# Patient Record
Sex: Male | Born: 1940 | Race: White | Hispanic: No | Marital: Married | Smoking: Never smoker
Health system: Southern US, Community
[De-identification: ages and names within clinical notes are randomized; demographics above are authoritative.]

## PROBLEM LIST (undated history)

## (undated) DIAGNOSIS — I10 Essential (primary) hypertension: Secondary | ICD-10-CM

## (undated) DIAGNOSIS — D332 Benign neoplasm of brain, unspecified: Secondary | ICD-10-CM

## (undated) DIAGNOSIS — G629 Polyneuropathy, unspecified: Secondary | ICD-10-CM

## (undated) DIAGNOSIS — H269 Unspecified cataract: Secondary | ICD-10-CM

## (undated) DIAGNOSIS — E119 Type 2 diabetes mellitus without complications: Secondary | ICD-10-CM

## (undated) DIAGNOSIS — Z9289 Personal history of other medical treatment: Secondary | ICD-10-CM

## (undated) DIAGNOSIS — E786 Lipoprotein deficiency: Secondary | ICD-10-CM

## (undated) DIAGNOSIS — C801 Malignant (primary) neoplasm, unspecified: Secondary | ICD-10-CM

## (undated) HISTORY — PX: CHOLECYSTECTOMY: SHX55

## (undated) HISTORY — PX: CERVICAL FUSION: SHX112

## (undated) HISTORY — PX: COLONOSCOPY: SHX174

---

## 2012-07-10 ENCOUNTER — Encounter (HOSPITAL_COMMUNITY): Payer: Self-pay | Admitting: *Deleted

## 2012-07-10 ENCOUNTER — Emergency Department (HOSPITAL_COMMUNITY): Payer: Medicare Other

## 2012-07-10 ENCOUNTER — Emergency Department (HOSPITAL_COMMUNITY)
Admission: EM | Admit: 2012-07-10 | Discharge: 2012-07-10 | Disposition: A | Discharge status: Home / Self Care | Payer: Medicare Other | Attending: Emergency Medicine | Admitting: Emergency Medicine

## 2012-07-10 DIAGNOSIS — R61 Generalized hyperhidrosis: Secondary | ICD-10-CM | POA: Insufficient documentation

## 2012-07-10 DIAGNOSIS — R5383 Other fatigue: Secondary | ICD-10-CM | POA: Insufficient documentation

## 2012-07-10 DIAGNOSIS — E162 Hypoglycemia, unspecified: Secondary | ICD-10-CM

## 2012-07-10 DIAGNOSIS — I4891 Unspecified atrial fibrillation: Secondary | ICD-10-CM | POA: Insufficient documentation

## 2012-07-10 DIAGNOSIS — R5381 Other malaise: Secondary | ICD-10-CM | POA: Insufficient documentation

## 2012-07-10 DIAGNOSIS — Z794 Long term (current) use of insulin: Secondary | ICD-10-CM | POA: Insufficient documentation

## 2012-07-10 DIAGNOSIS — Z79899 Other long term (current) drug therapy: Secondary | ICD-10-CM | POA: Insufficient documentation

## 2012-07-10 DIAGNOSIS — E1169 Type 2 diabetes mellitus with other specified complication: Secondary | ICD-10-CM | POA: Insufficient documentation

## 2012-07-10 DIAGNOSIS — E7800 Pure hypercholesterolemia, unspecified: Secondary | ICD-10-CM | POA: Insufficient documentation

## 2012-07-10 DIAGNOSIS — I10 Essential (primary) hypertension: Secondary | ICD-10-CM | POA: Insufficient documentation

## 2012-07-10 HISTORY — DX: Lipoprotein deficiency: E78.6

## 2012-07-10 HISTORY — DX: Type 2 diabetes mellitus without complications: E11.9

## 2012-07-10 HISTORY — DX: Essential (primary) hypertension: I10

## 2012-07-10 LAB — COMPREHENSIVE METABOLIC PANEL
ALT: 17 U/L (ref 0–53)
AST: 21 U/L (ref 0–37)
Alkaline Phosphatase: 59 U/L (ref 39–117)
CO2: 26 mEq/L (ref 19–32)
Calcium: 9.3 mg/dL (ref 8.4–10.5)
GFR calc Af Amer: 61 mL/min — ABNORMAL LOW (ref >90)
GFR calc non Af Amer: 53 mL/min — ABNORMAL LOW (ref >90)
Glucose, Bld: 98 mg/dL (ref 70–99)
Potassium: 3.7 mEq/L (ref 3.5–5.1)
Sodium: 136 mEq/L (ref 135–145)

## 2012-07-10 LAB — CBC WITH DIFFERENTIAL/PLATELET
Basophils Absolute: 0.1 10*3/uL (ref 0.0–0.1)
Basophils Relative: 1 % (ref 0–1)
Eosinophils Absolute: 0.2 10*3/uL (ref 0.0–0.7)
Eosinophils Relative: 1 % (ref 0–5)
HCT: 38.2 % — ABNORMAL LOW (ref 39.0–52.0)
Hemoglobin: 13.3 g/dL (ref 13.0–17.0)
Lymphocytes Relative: 11 % — ABNORMAL LOW (ref 12–46)
Lymphs Abs: 1.7 10*3/uL (ref 0.7–4.0)
MCH: 29.3 pg (ref 26.0–34.0)
MCHC: 34.8 g/dL (ref 30.0–36.0)
MCV: 84.1 fL (ref 78.0–100.0)
Monocytes Absolute: 1.9 10*3/uL — ABNORMAL HIGH (ref 0.1–1.0)
Monocytes Relative: 12 % (ref 3–12)
Neutro Abs: 11.6 10*3/uL — ABNORMAL HIGH (ref 1.7–7.7)
Neutrophils Relative %: 75 % (ref 43–77)
Platelets: 215 10*3/uL (ref 150–400)
RBC: 4.54 MIL/uL (ref 4.22–5.81)
RDW: 13.5 % (ref 11.5–15.5)
WBC: 15.5 10*3/uL — ABNORMAL HIGH (ref 4.0–10.5)

## 2012-07-10 LAB — GLUCOSE, CAPILLARY
Glucose-Capillary: 165 mg/dL — ABNORMAL HIGH (ref 70–99)
Glucose-Capillary: 174 mg/dL — ABNORMAL HIGH (ref 70–99)
Glucose-Capillary: 94 mg/dL (ref 70–99)

## 2012-07-10 LAB — URINALYSIS, ROUTINE W REFLEX MICROSCOPIC
Glucose, UA: NEGATIVE mg/dL
Hgb urine dipstick: NEGATIVE
Leukocytes, UA: NEGATIVE
Specific Gravity, Urine: 1.007 (ref 1.005–1.030)
pH: 5.5 (ref 5.0–8.0)

## 2012-07-10 LAB — POCT I-STAT TROPONIN I: Troponin i, poc: 0 ng/mL (ref 0.00–0.08)

## 2012-07-10 LAB — CG4 I-STAT (LACTIC ACID): Lactic Acid, Venous: 3.45 mmol/L — ABNORMAL HIGH (ref 0.5–2.2)

## 2012-07-10 MED ORDER — METOPROLOL TARTRATE 25 MG PO TABS: 25.0000 mg | ORAL_TABLET | Freq: Two times a day (BID) | ORAL | Status: DC

## 2012-07-10 MED ORDER — METOPROLOL TARTRATE 100 MG PO TABS: 50.0000 mg | ORAL_TABLET | Freq: Two times a day (BID) | ORAL | Status: DC

## 2012-07-10 MED ORDER — DILTIAZEM HCL 25 MG/5ML IV SOLN
10.0000 mg | Freq: Once | INTRAVENOUS | Status: AC
  Administered 2012-07-10: 10 mg via INTRAVENOUS
  Filled 2012-07-10: qty 5

## 2012-07-10 NOTE — ED Notes (Signed)
Original call was for patient with a sugar of 39.  Took his bedtime insulin without eating (35units Lantus).  EMS arrival [facility] 84.  Upon arrival to the hospital he was in A Fib.  2348 BS 86  Patient c/o feeling weak

## 2012-07-10 NOTE — ED Notes (Signed)
Family at bedside. 

## 2012-07-10 NOTE — ED Notes (Signed)
Pt ambulated to restroom. 

## 2012-07-10 NOTE — ED Provider Notes (Signed)
History     CSN: [id number]  Arrival date & time 07/10/12  0008   First MD Initiated Contact with Patient 07/10/12 0030      Chief Complaint  Patient presents with  . Hypoglycemia    (Consider location/radiation/quality/duration/timing/severity/associated sxs/prior treatment) The history is provided by the patient, the EMS personnel and the spouse.   a 72 year old male was in the [place] area. Primary care Dr. is in the [place] area. Patient is a known diabetic he was feeling fine until after taking his bedtime insulin dose of 35 units of Lantus which is his normal amount he had had dinner earlier but did not eat as much his usual. Wife noted that his blood sugar was low at 39 she came sugary drinks when EMS arrived blood sugar was 84. EMS also noted as yet the hospital he had a rapid irregular rate of atrial fib heart rate upon arrival here was 147. Patient was ashen. Patient denied any chest pain abdominal pain or shortness of breath. Patient does have a history of atrial fib.  Past Medical History  Diagnosis Date  . Hypertension   . Hypocholesteremia   . Diabetes mellitus without complication     History reviewed. No pertinent past surgical history.  History reviewed. No pertinent family history.  History  Substance Use Topics  . Smoking status: Never Smoker   . Smokeless tobacco: Never Used  . Alcohol Use: No      Review of Systems  Constitutional: Positive for diaphoresis and fatigue. Negative for fever.  Respiratory: Negative for chest tightness and shortness of breath.   Cardiovascular: Negative for chest pain and palpitations.  Gastrointestinal: Negative for nausea, vomiting and abdominal pain.  Genitourinary: Negative for dysuria.  Skin: Positive for pallor.  Neurological: Negative for headaches.  Hematological: Does not bruise/bleed easily.  Psychiatric/Behavioral: Positive for confusion.    Allergies  Review of patient's allergies indicates no known  allergies.  Home Medications   Current Outpatient Rx  Name  Route  Sig  Dispense  Refill  . Cinnamon 500 MG capsule   Oral   Take 1,000 mg by mouth daily.         . Cyanocobalamin (VITAMIN B 12 PO)   Oral   Take 1 tablet by mouth daily.         . insulin glargine (LANTUS) 100 UNIT/ML injection   Subcutaneous   Inject 35 Units into the skin at bedtime.         Marland Kitchen lisinopril (PRINIVIL,ZESTRIL) 20 MG tablet   Oral   Take 20 mg by mouth daily.         Marland Kitchen lovastatin (MEVACOR) 20 MG tablet   Oral   Take 20 mg by mouth daily.         . sitaGLIPtan-metformin (JANUMET) 50-500 MG per tablet   Oral   Take 1 tablet by mouth 2 (two) times daily with a meal.           BP 110/61  Pulse 62  Temp(Src) 96.1 F (35.6 C) (Rectal)  Resp 13  SpO2 97%  Physical Exam  Nursing note and vitals reviewed. Constitutional: He is oriented to person, place, and time. He appears well-developed and well-nourished.  HENT:  Head: Normocephalic and atraumatic.  Mouth/Throat: Oropharynx is clear and moist.  Eyes: Conjunctivae and EOM are normal. Pupils are equal, round, and reactive to light.  Neck: Normal range of motion. Neck supple.  Cardiovascular: Normal rate and normal heart sounds.   No  murmur heard. Rapid irregular heartbeat  Pulmonary/Chest: Effort normal and breath sounds normal. No respiratory distress. He has no wheezes. He has no rales.  Abdominal: Soft. Bowel sounds are normal. There is no tenderness.  Musculoskeletal: Normal range of motion. He exhibits no edema and no tenderness.  Neurological: He is alert and oriented to person, place, and time. No cranial nerve deficit. He exhibits normal muscle tone. Coordination normal.  Skin: He is not diaphoretic. There is pallor.    ED Course  Procedures (including critical care time)  Labs Reviewed  CBC WITH DIFFERENTIAL - Abnormal; Notable for the following:    WBC 15.5 (*)    HCT 38.2 (*)    Neutro Abs 11.6 (*)     Lymphocytes Relative 11 (*)    Monocytes Absolute 1.9 (*)    All other components within normal limits  COMPREHENSIVE METABOLIC PANEL - Abnormal; Notable for the following:    BUN 25 (*)    Total Bilirubin 0.2 (*)    GFR calc non Af Amer 53 (*)    GFR calc Af Amer 61 (*)    All other components within normal limits  GLUCOSE, CAPILLARY - Abnormal; Notable for the following:    Glucose-Capillary 174 (*)    All other components within normal limits  CG4 I-STAT (LACTIC ACID) - Abnormal; Notable for the following:    Lactic Acid, Venous 3.45 (*)    All other components within normal limits  CULTURE, BLOOD (ROUTINE X 2)  CULTURE, BLOOD (ROUTINE X 2)  GLUCOSE, CAPILLARY  URINALYSIS, ROUTINE W REFLEX MICROSCOPIC  POCT I-STAT TROPONIN I   Dg Chest 2 View  07/10/2012  *RADIOLOGY REPORT*  Clinical Data: Hypoglycemia.  CHEST - 2 VIEW  Comparison: 07/10/2012  Findings: Mild hyperinflation.  Interstitial changes suggesting slight fibrosis.  Peribronchial thickening suggesting chronic bronchitis.  No blunting of costophrenic angles.  No pneumothorax. No focal consolidation or airspace disease in the lungs. Mediastinal contours appear intact.  Postoperative changes in the cervical spine and right upper quadrant.  Degenerative changes in the thoracic spine.  IMPRESSION: Hyperinflation with slight fibrosis and chronic bronchitic changes. Changes may represent asthma.  No focal consolidation.   Original Report Authenticated By: Burman Nieves, M.D.    Dg Chest Port 1 View  07/10/2012  *RADIOLOGY REPORT*  Clinical Data: Weakness and hypoglycemia.  PORTABLE CHEST - 1 VIEW  Comparison: None.  Findings: The shallow inspiration.  Borderline heart size and pulmonary vascularity are likely normal for inspiratory effort. Hazy appearance of left heart border suggests infiltration in the left lung base.  This could represent atelectasis or early pneumonia.  No blunting of costophrenic angles.  No pneumothorax.  Mediastinal contours appear intact.  IMPRESSION: Shallow inspiration.  Hazy opacity in the left lung base may represent atelectasis or early pneumonia.   Original Report Authenticated By: Burman Nieves, M.D.     Date: 07/10/2012  Rate: 141  Rhythm: atrial fibrillation  QRS Axis: normal  Intervals: normal  ST/T Wave abnormalities: nonspecific T wave changes  Conduction Disutrbances:none  Narrative Interpretation:   Old EKG Reviewed: none available  Results for orders placed during the hospital encounter of 07/10/12  GLUCOSE, CAPILLARY      Result Value Range   Glucose-Capillary 94  70 - 99 mg/dL   Comment 1 Notify RN     Comment 2 Documented in Chart    CBC WITH DIFFERENTIAL      Result Value Range   WBC 15.5 (*) 4.0 - 10.5 K/uL  RBC 4.54  4.22 - 5.81 MIL/uL   Hemoglobin 13.3  13.0 - 17.0 g/dL   HCT 29.5 (*) 62.1 - 30.8 %   MCV 84.1  78.0 - 100.0 fL   MCH 29.3  26.0 - 34.0 pg   MCHC 34.8  30.0 - 36.0 g/dL   RDW 65.7  84.6 - 96.2 %   Platelets 215  150 - 400 K/uL   Neutrophils Relative 75  43 - 77 %   Neutro Abs 11.6 (*) 1.7 - 7.7 K/uL   Lymphocytes Relative 11 (*) 12 - 46 %   Lymphs Abs 1.7  0.7 - 4.0 K/uL   Monocytes Relative 12  3 - 12 %   Monocytes Absolute 1.9 (*) 0.1 - 1.0 K/uL   Eosinophils Relative 1  0 - 5 %   Eosinophils Absolute 0.2  0.0 - 0.7 K/uL   Basophils Relative 1  0 - 1 %   Basophils Absolute 0.1  0.0 - 0.1 K/uL  URINALYSIS, ROUTINE W REFLEX MICROSCOPIC      Result Value Range   Color, Urine YELLOW  YELLOW   APPearance CLEAR  CLEAR   Specific Gravity, Urine 1.007  1.005 - 1.030   pH 5.5  5.0 - 8.0   Glucose, UA NEGATIVE  NEGATIVE mg/dL   Hgb urine dipstick NEGATIVE  NEGATIVE   Bilirubin Urine NEGATIVE  NEGATIVE   Ketones, ur NEGATIVE  NEGATIVE mg/dL   Protein, ur NEGATIVE  NEGATIVE mg/dL   Urobilinogen, UA 0.2  0.0 - 1.0 mg/dL   Nitrite NEGATIVE  NEGATIVE   Leukocytes, UA NEGATIVE  NEGATIVE  COMPREHENSIVE METABOLIC PANEL      Result Value  Range   Sodium 136  135 - 145 mEq/L   Potassium 3.7  3.5 - 5.1 mEq/L   Chloride 98  96 - 112 mEq/L   CO2 26  19 - 32 mEq/L   Glucose, Bld 98  70 - 99 mg/dL   BUN 25 (*) 6 - 23 mg/dL   Creatinine, Ser 9.52  0.50 - 1.35 mg/dL   Calcium 9.3  8.4 - 84.1 mg/dL   Total Protein 7.4  6.0 - 8.3 g/dL   Albumin 3.7  3.5 - 5.2 g/dL   AST 21  0 - 37 U/L   ALT 17  0 - 53 U/L   Alkaline Phosphatase 59  39 - 117 U/L   Total Bilirubin 0.2 (*) 0.3 - 1.2 mg/dL   GFR calc non Af Amer 53 (*) >90 mL/min   GFR calc Af Amer 61 (*) >90 mL/min  GLUCOSE, CAPILLARY      Result Value Range   Glucose-Capillary 174 (*) 70 - 99 mg/dL  CG4 I-STAT (LACTIC ACID)      Result Value Range   Lactic Acid, Venous 3.45 (*) 0.5 - 2.2 mmol/L  POCT I-STAT TROPONIN I      Result Value Range   Troponin i, poc 0.00  0.00 - 0.08 ng/mL   Comment 3              1. Hypoglycemia   2. Atrial fibrillation with rapid ventricular response     CRITICAL CARE Performed by: Shelda Jakes.   Total critical care time: 30  Critical care time was exclusive of separately billable procedures and treating other patients.  Critical care was necessary to treat or prevent imminent or life-threatening deterioration.  Critical care was time spent personally by me on the following activities: development of treatment plan with  patient and/or surrogate as well as nursing, discussions with consultants, evaluation of patient's response to treatment, examination of patient, obtaining history from patient or surrogate, ordering and performing treatments and interventions, ordering and review of laboratory studies, ordering and review of radiographic studies, pulse oximetry and re-evaluation of patient's condition.   MDM   Patient brought in by EMS [place] calls for the patient with a blood sugar of 39 which definitely was consistent with hypoglycemia. Family members gave him of some sugary drinks at home when EMS arrived blood sugar was 84.  On arrival to the hospital patient was in rapid atrial fibrillation with a heart rate of 147. Patient does not have a history of atrial fibrillation. Patient felt fine earlier in the day. He did eat dinner but not as much his usual. He normally takes insulin at bedtime his normal amounts 35 units of Lantus that is unchanged. Following that he became hypoglycemic. Initially there was some concern for pneumonia on the chest x-ray which led to the blood cultures and a lactate level being done. Lactate level was elevated but less than for repeat chest x-ray was negative for pneumonic process. Patient after correction of the atrial fibrillation with 10 mg of diltiazem IV push, which converted him to normal sinus rhythm he immediately felt much better. Patient was unaware that he was having a rapid heart rate or palpitations. Denied any tach chest pain or discomfort. Patient's troponin was negative. EKG other than atrial fib had no acute changes. Patient does continue to feel fine his blood sugars have remained up will discharge home with cardiology followup patient will return for any newer worse symptoms. Also followup with primary care Dr. in the [place] area. Patient will be discharged home with Lopressor 25 mg to take on a when necessary basis if he has palpitations or fast heart rate. Patient understands importance of following up with cardiology. In ED we continue to monitor his blood sugars have remained above 100 without and having to eat.        Shelda Jakes, MD 07/10/12 [phone]

## 2012-07-10 NOTE — ED Notes (Signed)
Radiology at bedside

## 2012-07-10 NOTE — ED Notes (Signed)
Patient transported to X-ray 

## 2012-07-16 LAB — CULTURE, BLOOD (ROUTINE X 2): Culture: NO GROWTH

## 2013-09-09 ENCOUNTER — Other Ambulatory Visit: Payer: Self-pay | Admitting: Gastroenterology

## 2013-09-09 ENCOUNTER — Encounter (HOSPITAL_COMMUNITY): Payer: Self-pay | Admitting: Pharmacy Technician

## 2013-09-09 ENCOUNTER — Encounter (HOSPITAL_COMMUNITY): Payer: Self-pay | Admitting: *Deleted

## 2013-09-09 NOTE — Addendum Note (Signed)
Addended by: Chosen Geske on: 09/09/2013 02:29 PM   Modules accepted: Orders  

## 2013-09-11 ENCOUNTER — Encounter (HOSPITAL_COMMUNITY): Payer: Medicare HMO | Admitting: Anesthesiology

## 2013-09-11 ENCOUNTER — Ambulatory Visit (HOSPITAL_COMMUNITY): Payer: Medicare HMO | Admitting: Anesthesiology

## 2013-09-11 ENCOUNTER — Ambulatory Visit (HOSPITAL_COMMUNITY)
Admission: RE | Admit: 2013-09-11 | Discharge: 2013-09-11 | Disposition: A | Discharge status: Home / Self Care | Payer: Medicare HMO | Source: Ambulatory Visit | Attending: Gastroenterology | Admitting: Gastroenterology

## 2013-09-11 ENCOUNTER — Encounter (HOSPITAL_COMMUNITY)
Admission: RE | Disposition: A | Discharge status: Home / Self Care | Payer: Self-pay | Source: Ambulatory Visit | Attending: Gastroenterology

## 2013-09-11 ENCOUNTER — Encounter (HOSPITAL_COMMUNITY): Payer: Self-pay

## 2013-09-11 DIAGNOSIS — I10 Essential (primary) hypertension: Secondary | ICD-10-CM | POA: Insufficient documentation

## 2013-09-11 DIAGNOSIS — C250 Malignant neoplasm of head of pancreas: Secondary | ICD-10-CM | POA: Insufficient documentation

## 2013-09-11 DIAGNOSIS — E119 Type 2 diabetes mellitus without complications: Secondary | ICD-10-CM | POA: Insufficient documentation

## 2013-09-11 DIAGNOSIS — Z794 Long term (current) use of insulin: Secondary | ICD-10-CM | POA: Insufficient documentation

## 2013-09-11 DIAGNOSIS — R634 Abnormal weight loss: Secondary | ICD-10-CM | POA: Insufficient documentation

## 2013-09-11 HISTORY — PX: FINE NEEDLE ASPIRATION: SHX5430

## 2013-09-11 HISTORY — DX: Malignant (primary) neoplasm, unspecified: C80.1

## 2013-09-11 HISTORY — PX: EUS: SHX5427

## 2013-09-11 LAB — GLUCOSE, CAPILLARY
GLUCOSE-CAPILLARY: 136 mg/dL — AB (ref 70–99)
Glucose-Capillary: 136 mg/dL — ABNORMAL HIGH (ref 70–99)

## 2013-09-11 SURGERY — ESOPHAGEAL ENDOSCOPIC ULTRASOUND (EUS) RADIAL
Anesthesia: Monitor Anesthesia Care

## 2013-09-11 MED ORDER — CIPROFLOXACIN IN D5W 400 MG/200ML IV SOLN
INTRAVENOUS | Status: DC | PRN
  Administered 2013-09-11: 400 mg via INTRAVENOUS

## 2013-09-11 MED ORDER — PROPOFOL 10 MG/ML IV EMUL
INTRAVENOUS | Status: DC | PRN
  Administered 2013-09-11: 40 mg via INTRAVENOUS

## 2013-09-11 MED ORDER — LIDOCAINE HCL (CARDIAC) 20 MG/ML IV SOLN
INTRAVENOUS | Status: AC
  Filled 2013-09-11: qty 5

## 2013-09-11 MED ORDER — PROPOFOL INFUSION 10 MG/ML OPTIME
INTRAVENOUS | Status: DC | PRN
  Administered 2013-09-11: 100 ug/kg/min via INTRAVENOUS

## 2013-09-11 MED ORDER — RIVAROXABAN 20 MG PO TABS: 20.0000 mg | ORAL_TABLET | Freq: Every day | ORAL | Status: DC

## 2013-09-11 MED ORDER — PROPOFOL 10 MG/ML IV BOLUS
INTRAVENOUS | Status: AC
  Filled 2013-09-11: qty 20

## 2013-09-11 MED ORDER — LACTATED RINGERS IV SOLN
INTRAVENOUS | Status: DC
  Administered 2013-09-11: 11:00:00 via INTRAVENOUS

## 2013-09-11 MED ORDER — HYDROMORPHONE HCL 4 MG PO TABS: 4.0000 mg | ORAL_TABLET | ORAL | Status: DC | PRN

## 2013-09-11 MED ORDER — MIDAZOLAM HCL 2 MG/2ML IJ SOLN
INTRAMUSCULAR | Status: AC
  Filled 2013-09-11: qty 2

## 2013-09-11 MED ORDER — FENTANYL CITRATE 0.05 MG/ML IJ SOLN
INTRAMUSCULAR | Status: AC
  Filled 2013-09-11: qty 2

## 2013-09-11 MED ORDER — KETAMINE HCL 10 MG/ML IJ SOLN
INTRAMUSCULAR | Status: DC | PRN
  Administered 2013-09-11: 20 mg via INTRAVENOUS

## 2013-09-11 MED ORDER — SODIUM CHLORIDE 0.9 % IV SOLN
INTRAVENOUS | Status: DC
Start: 2013-09-11 — End: 2013-09-11

## 2013-09-11 MED ORDER — CIPROFLOXACIN IN D5W 400 MG/200ML IV SOLN
INTRAVENOUS | Status: AC
  Filled 2013-09-11: qty 200

## 2013-09-11 MED ORDER — LACTATED RINGERS IV SOLN
INTRAVENOUS | Status: DC | PRN
  Administered 2013-09-11: 12:00:00 via INTRAVENOUS

## 2013-09-11 NOTE — H&P (Signed)
Patient interval history reviewed.  Patient examined again.  There has been no change from documented H/P dated 09/05/13 (scanned into chart from our office) except as documented above.  Assessment:  1.  Pancreatic cystic mass. 2.  Abdominal pain. 3.  Weight loss.  Plan:  1.  Endoscopic ultrasound with possible fine needle aspiration +/- cyst fluid aspiration. 2.  Risks (bleeding, infection, bowel perforation that could require surgery, sedation-related changes in cardiopulmonary systems), benefits (identification and possible treatment of source of symptoms, exclusion of certain causes of symptoms), and alternatives (watchful waiting, radiographic imaging studies, empiric medical treatment) of upper endoscopy with ultrasound and possible fine needle aspiration and/or cyst aspiration (EGD +/- FNA) were explained to patient/family in detail and patient wishes to proceed.

## 2013-09-11 NOTE — Op Note (Signed)
[place], [address]   ENDOSCOPIC ULTRASOUND PROCEDURE REPORT  PATIENT: Christopher Clark, Christopher Clark  MR#: [id number] BIRTHDATE: 04/21/1940  GENDER: Male ENDOSCOPIST: Arta Silence, MD REFERRED BY:  Greig Right, M.D. , Nehemiah Settle, MD PROCEDURE DATE:  09/11/2013 PROCEDURE:   Upper EUS w/FNA ASA CLASS:      Class III INDICATIONS:   1.  pancreatic mass, weight loss, abdominal pain. MEDICATIONS: MAC sedation, administered by CRNA and Cetacaine spray x 2  DESCRIPTION OF PROCEDURE:   After the risks benefits and alternatives of the procedure were  explained, informed consent was obtained. The patient was then placed in the left, lateral, decubitus postion and IV sedation was administered. Throughout the procedure, the patients blood pressure, pulse and oxygen saturations were monitored continuously.  Under direct visualization, the Pentax EUS Linear [id number]  endoscope was introduced through the mouth  and advanced to the second portion of the duodenum .  Water was used as necessary to provide an acoustic interface.  Upon completion of the imaging, water was removed and the patient was sent to the recovery room in satisfactory condition.    FINDINGS:      Ill-defined heterogeneous predominantly hypoechoic lesion with extensive necrosis, approximately 84mm x 15mm in size was identified at the interface of the head and neck of the pancreas.  There was upstream pancreatic ductal dilatation.  Bile duct is of normal caliber, and lesion doesn't seem to involve the extrahepatic biliary tree.  Lesion invades the celiac artery near the bifurcation of the hepatic and splenic arteries; the lesion involves the portal vein and the portal confluence.  There are numerous round well-defined hypoechoic malignant-appearing peritumoral lymph nodes seen.  Lesion was biopsied x 3 with 22-g FNA needle (one pass with suction, two passes without suction). Preliminary  cytology, reviewed in my presence by Dr. Saralyn Pilar, is worrisome for adenocarcinoma with extensive tumor necrosis.  IMPRESSION:     Pancreatic mass with necrosis.  Preliminary cytology worrisome for adenocarcinoma.  Assuming this is an adenocarcinoma, staging would be T4 N1 Mx by endoscopic ultrasound (understanding that there are worrisome liver lesions as well on recent CT scan which are out of visual field of the echoendoscope).  RECOMMENDATIONS:     1.  Watch for potential complications of procedure. 2.  Await final cytology results. 3.  Restart Xarelto in 2 days. 4.  Oncology referral, which because of his insurance plan will need to be coordinated by patient's primary physician. 5.  Case discussed in detail with Dr. Melina Copa.   _______________________________ Lorrin MaisArta Silence, MD 09/11/2013 1:21 PM  CC:

## 2013-09-11 NOTE — Discharge Instructions (Signed)
Gastrointestinal Endoscopy °Care After °Refer to this sheet in the next few weeks. These instructions provide you with information on caring for yourself after your procedure. Your caregiver may also give you more specific instructions. Your treatment has been planned according to current medical practices, but problems sometimes occur. Call your caregiver if you have any problems or questions after your procedure. °HOME CARE INSTRUCTIONS °· If you were given medicine to help you relax (sedative), do not drive, operate machinery, or sign important documents for 24 hours. °· Avoid alcohol and hot or warm beverages for the first 24 hours after the procedure. °· Only take over-the-counter or prescription medicines for pain, discomfort, or fever as directed by your caregiver. You may resume taking your normal medicines unless your caregiver tells you otherwise. Ask your caregiver when you may resume taking medicines that may cause bleeding, such as aspirin, clopidogrel, or warfarin. °· You may return to your normal diet and activities on the day after your procedure, or as directed by your caregiver. Walking may help to reduce any bloated feeling in your abdomen. °· Drink enough fluids to keep your urine clear or pale yellow. °· You may gargle with salt water if you have a sore throat. °SEEK IMMEDIATE MEDICAL CARE IF: °· You have severe nausea or vomiting. °· You have severe abdominal pain, abdominal cramps that last longer than 6 hours, or abdominal swelling (distention). °· You have severe shoulder or back pain. °· You have trouble swallowing. °· You have shortness of breath, your breathing is shallow, or you are breathing faster than normal. °· You have a fever or a rapid heartbeat. °· You vomit blood or material that looks like coffee grounds. °· You have bloody, black, or tarry stools. °MAKE SURE YOU: °· Understand these instructions. °· Will watch your condition. °· Will get help right away if you are not doing  well or get worse. °Document Released: 11/10/2003 Document Revised: 09/27/2011 Document Reviewed: 06/28/2011 °Exit[employer]® Patient Information ©2014 [employer]. ° °

## 2013-09-11 NOTE — Anesthesia Preprocedure Evaluation (Addendum)
Anesthesia Evaluation  Patient identified by MRN, date of birth, ID band Patient awake    Reviewed: Allergy & Precautions, H&P , NPO status , Patient's Chart, lab work & pertinent test results  Airway Mallampati: II TM Distance: >3 FB Neck ROM: Full    Dental   Pulmonary neg pulmonary ROS,          Cardiovascular hypertension, Pt. on medications + dysrhythmias Rhythm:Regular Rate:Normal     Neuro/Psych negative neurological ROS  negative psych ROS   GI/Hepatic negative GI ROS, Neg liver ROS,   Endo/Other  diabetes, Type 2, Insulin Dependent  Renal/GU negative Renal ROS  negative genitourinary   Musculoskeletal negative musculoskeletal ROS (+)   Abdominal   Peds negative pediatric ROS (+)  Hematology negative hematology ROS (+)   Anesthesia Other Findings   Reproductive/Obstetrics negative OB ROS                         Anesthesia Physical Anesthesia Plan  ASA: III  Anesthesia Plan: MAC   Post-op Pain Management:    Induction: Intravenous  Airway Management Planned:   Additional Equipment:   Intra-op Plan:   Post-operative Plan:   Informed Consent: I have reviewed the patients History and Physical, chart, labs and discussed the procedure including the risks, benefits and alternatives for the proposed anesthesia with the patient or authorized representative who has indicated his/her understanding and acceptance.   Dental advisory given  Plan Discussed with: CRNA  Anesthesia Plan Comments:         Anesthesia Quick Evaluation

## 2013-09-11 NOTE — Transfer of Care (Signed)
Immediate Anesthesia Transfer of Care Note  Patient: Christopher Clark  Procedure(s) Performed: Procedure(s): ESOPHAGEAL ENDOSCOPIC ULTRASOUND (EUS) RADIAL (N/A) FINE NEEDLE ASPIRATION (FNA) RADIAL (N/A)  Patient Location: PACU  Anesthesia Type:MAC  Level of Consciousness: awake, alert  and oriented  Airway & Oxygen Therapy: Patient Spontanous Breathing and Patient connected to nasal cannula oxygen  Post-op Assessment: Report given to PACU RN and Post -op Vital signs reviewed and stable  Post vital signs: Reviewed and stable  Complications: No apparent anesthesia complications

## 2013-09-12 ENCOUNTER — Encounter (HOSPITAL_COMMUNITY): Payer: Self-pay | Admitting: Gastroenterology

## 2013-09-13 NOTE — Anesthesia Postprocedure Evaluation (Signed)
Anesthesia Post Note  Patient: Christopher Clark  Procedure(s) Performed: Procedure(s) (LRB): ESOPHAGEAL ENDOSCOPIC ULTRASOUND (EUS) RADIAL (N/A) FINE NEEDLE ASPIRATION (FNA) RADIAL (N/A)  Anesthesia type: MAC  Patient location: PACU  Post pain: Pain level controlled  Post assessment: Post-op Vital signs reviewed  Last Vitals: BP 186/78  Pulse 56  Temp(Src) 36.4 C (Oral)  Resp 16  Ht 5\' 11"  (1.803 m)  Wt 172 lb (78.019 kg)  BMI 24.00 kg/m2  SpO2 94%  Post vital signs: Reviewed  Level of consciousness: awake  Complications: No apparent anesthesia complications

## 2015-05-04 DIAGNOSIS — C259 Malignant neoplasm of pancreas, unspecified: Secondary | ICD-10-CM | POA: Diagnosis not present

## 2015-05-17 DIAGNOSIS — J069 Acute upper respiratory infection, unspecified: Secondary | ICD-10-CM | POA: Diagnosis not present

## 2015-05-18 DIAGNOSIS — J069 Acute upper respiratory infection, unspecified: Secondary | ICD-10-CM | POA: Diagnosis not present

## 2015-05-21 DIAGNOSIS — E11319 Type 2 diabetes mellitus with unspecified diabetic retinopathy without macular edema: Secondary | ICD-10-CM | POA: Diagnosis not present

## 2015-05-21 DIAGNOSIS — I10 Essential (primary) hypertension: Secondary | ICD-10-CM | POA: Diagnosis not present

## 2015-05-21 DIAGNOSIS — E7800 Pure hypercholesterolemia, unspecified: Secondary | ICD-10-CM | POA: Diagnosis not present

## 2015-05-21 DIAGNOSIS — G609 Hereditary and idiopathic neuropathy, unspecified: Secondary | ICD-10-CM | POA: Diagnosis not present

## 2015-05-21 DIAGNOSIS — E1140 Type 2 diabetes mellitus with diabetic neuropathy, unspecified: Secondary | ICD-10-CM | POA: Diagnosis not present

## 2015-05-21 DIAGNOSIS — Z0000 Encounter for general adult medical examination without abnormal findings: Secondary | ICD-10-CM | POA: Diagnosis not present

## 2015-05-21 DIAGNOSIS — C250 Malignant neoplasm of head of pancreas: Secondary | ICD-10-CM | POA: Diagnosis not present

## 2015-08-07 DIAGNOSIS — S20212A Contusion of left front wall of thorax, initial encounter: Secondary | ICD-10-CM | POA: Diagnosis not present

## 2015-08-26 DIAGNOSIS — Z79899 Other long term (current) drug therapy: Secondary | ICD-10-CM | POA: Diagnosis not present

## 2015-08-26 DIAGNOSIS — E1140 Type 2 diabetes mellitus with diabetic neuropathy, unspecified: Secondary | ICD-10-CM | POA: Diagnosis not present

## 2015-08-26 DIAGNOSIS — R350 Frequency of micturition: Secondary | ICD-10-CM | POA: Diagnosis not present

## 2015-08-26 DIAGNOSIS — D500 Iron deficiency anemia secondary to blood loss (chronic): Secondary | ICD-10-CM | POA: Diagnosis not present

## 2015-09-07 DIAGNOSIS — C259 Malignant neoplasm of pancreas, unspecified: Secondary | ICD-10-CM | POA: Diagnosis not present

## 2015-09-07 DIAGNOSIS — C250 Malignant neoplasm of head of pancreas: Secondary | ICD-10-CM | POA: Diagnosis not present

## 2015-09-07 DIAGNOSIS — C787 Secondary malignant neoplasm of liver and intrahepatic bile duct: Secondary | ICD-10-CM | POA: Diagnosis not present

## 2015-09-09 DIAGNOSIS — C250 Malignant neoplasm of head of pancreas: Secondary | ICD-10-CM | POA: Diagnosis not present

## 2015-09-09 DIAGNOSIS — C787 Secondary malignant neoplasm of liver and intrahepatic bile duct: Secondary | ICD-10-CM | POA: Diagnosis not present

## 2015-09-09 DIAGNOSIS — Z452 Encounter for adjustment and management of vascular access device: Secondary | ICD-10-CM | POA: Diagnosis not present

## 2015-11-26 DIAGNOSIS — E11319 Type 2 diabetes mellitus with unspecified diabetic retinopathy without macular edema: Secondary | ICD-10-CM | POA: Diagnosis not present

## 2015-11-26 DIAGNOSIS — R072 Precordial pain: Secondary | ICD-10-CM | POA: Diagnosis not present

## 2015-11-26 DIAGNOSIS — E1140 Type 2 diabetes mellitus with diabetic neuropathy, unspecified: Secondary | ICD-10-CM | POA: Diagnosis not present

## 2015-11-26 DIAGNOSIS — I10 Essential (primary) hypertension: Secondary | ICD-10-CM | POA: Diagnosis not present

## 2015-12-13 DIAGNOSIS — E860 Dehydration: Secondary | ICD-10-CM | POA: Diagnosis not present

## 2015-12-13 DIAGNOSIS — J0100 Acute maxillary sinusitis, unspecified: Secondary | ICD-10-CM | POA: Diagnosis not present

## 2015-12-18 DIAGNOSIS — I10 Essential (primary) hypertension: Secondary | ICD-10-CM | POA: Diagnosis not present

## 2015-12-21 DIAGNOSIS — E11319 Type 2 diabetes mellitus with unspecified diabetic retinopathy without macular edema: Secondary | ICD-10-CM | POA: Diagnosis not present

## 2015-12-24 ENCOUNTER — Other Ambulatory Visit (HOSPITAL_COMMUNITY): Payer: Self-pay | Admitting: Neurosurgery

## 2015-12-24 ENCOUNTER — Other Ambulatory Visit: Payer: Self-pay | Admitting: Neurosurgery

## 2015-12-24 ENCOUNTER — Other Ambulatory Visit: Payer: Self-pay | Admitting: Radiation Oncology

## 2015-12-24 DIAGNOSIS — I10 Essential (primary) hypertension: Secondary | ICD-10-CM | POA: Diagnosis not present

## 2015-12-24 DIAGNOSIS — C7931 Secondary malignant neoplasm of brain: Secondary | ICD-10-CM

## 2015-12-24 DIAGNOSIS — C259 Malignant neoplasm of pancreas, unspecified: Secondary | ICD-10-CM

## 2015-12-24 NOTE — Progress Notes (Signed)
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 Rivett c/o Chronic, dull frontal headache with persistent headaches, at least, 1 month, difficulty with balance and nausea and vomiting and denied vision changes or focal weakness  Past or anticipated interventions, if any, per neurosurgery: MRI. Dr. Kary Kos  Past or anticipated interventions, if any, per medical oncology: Palliative chemotherapy in past with FOLFIRI - 30 cycles- chemo once every 8 weeks for pancreatic cancer  Dose of Decadron, if applicable: 4 mg po BID  Recent neurologic symptoms, if any:   Seizures: No  Headaches: Frontal headache upon diagnoses, but states he has decreased in intensity since taking Decadron  Nausea: Yes, with vomiting  Dizziness/ataxia: Yes  Difficulty with hand coordination: None  Focal numbness/weakness: No  Visual deficits/changes: No  Confusion/Memory deficits: None  Painful bone metastases at present, if any: No  SAFETY ISSUES:  Prior radiation? No  Pacemaker/ICD? No  Possible current pregnancy? N/A  Is the patient on methotrexate? no  Additional Complaints / other details: Primary Cancer - Pancreatic Cancer  periphral neuropathy with numbness of feet

## 2015-12-25 ENCOUNTER — Ambulatory Visit (HOSPITAL_COMMUNITY)
Admission: RE | Admit: 2015-12-25 | Discharge: 2015-12-25 | Disposition: A | Discharge status: Home / Self Care | Payer: PPO | Source: Ambulatory Visit | Attending: Neurosurgery | Admitting: Neurosurgery

## 2015-12-25 ENCOUNTER — Ambulatory Visit
Admission: RE | Admit: 2015-12-25 | Discharge: 2015-12-25 | Disposition: A | Discharge status: Home / Self Care | Payer: No Typology Code available for payment source | Source: Ambulatory Visit | Attending: Radiation Oncology | Admitting: Radiation Oncology

## 2015-12-25 ENCOUNTER — Ambulatory Visit: Admission: RE | Admit: 2015-12-25 | Payer: PPO | Source: Ambulatory Visit | Admitting: Radiation Oncology

## 2015-12-25 ENCOUNTER — Ambulatory Visit
Admission: RE | Admit: 2015-12-25 | Discharge: 2015-12-25 | Disposition: A | Discharge status: Home / Self Care | Payer: PPO | Source: Ambulatory Visit

## 2015-12-25 ENCOUNTER — Encounter: Payer: Self-pay | Admitting: Radiation Oncology

## 2015-12-25 ENCOUNTER — Ambulatory Visit
Admission: RE | Admit: 2015-12-25 | Discharge: 2015-12-25 | Disposition: A | Discharge status: Home / Self Care | Payer: PPO | Source: Ambulatory Visit | Attending: Radiation Oncology | Admitting: Radiation Oncology

## 2015-12-25 VITALS — BP 130/63 | HR 69 | Temp 98.0°F | Ht 71.0 in | Wt 168.9 lb

## 2015-12-25 VITALS — BP 140/60

## 2015-12-25 DIAGNOSIS — C7931 Secondary malignant neoplasm of brain: Secondary | ICD-10-CM | POA: Diagnosis not present

## 2015-12-25 DIAGNOSIS — C259 Malignant neoplasm of pancreas, unspecified: Secondary | ICD-10-CM | POA: Insufficient documentation

## 2015-12-25 DIAGNOSIS — Z79899 Other long term (current) drug therapy: Secondary | ICD-10-CM | POA: Diagnosis not present

## 2015-12-25 DIAGNOSIS — I10 Essential (primary) hypertension: Secondary | ICD-10-CM | POA: Diagnosis not present

## 2015-12-25 DIAGNOSIS — E860 Dehydration: Secondary | ICD-10-CM | POA: Diagnosis not present

## 2015-12-25 DIAGNOSIS — Z794 Long term (current) use of insulin: Secondary | ICD-10-CM | POA: Diagnosis not present

## 2015-12-25 DIAGNOSIS — G939 Disorder of brain, unspecified: Secondary | ICD-10-CM | POA: Diagnosis not present

## 2015-12-25 DIAGNOSIS — E119 Type 2 diabetes mellitus without complications: Secondary | ICD-10-CM | POA: Insufficient documentation

## 2015-12-25 DIAGNOSIS — Z510 Encounter for antineoplastic radiation therapy: Secondary | ICD-10-CM | POA: Insufficient documentation

## 2015-12-25 DIAGNOSIS — N289 Disorder of kidney and ureter, unspecified: Secondary | ICD-10-CM | POA: Insufficient documentation

## 2015-12-25 LAB — COMPREHENSIVE METABOLIC PANEL
ALT: 31 U/L (ref 0–55)
ANION GAP: 16 meq/L — AB (ref 3–11)
AST: 15 U/L (ref 5–34)
Albumin: 3.8 g/dL (ref 3.5–5.0)
Alkaline Phosphatase: 63 U/L (ref 40–150)
BUN: 49.5 mg/dL — ABNORMAL HIGH (ref 7.0–26.0)
CHLORIDE: 95 meq/L — AB (ref 98–109)
CO2: 19 meq/L — AB (ref 22–29)
Calcium: 9.6 mg/dL (ref 8.4–10.4)
Creatinine: 1.8 mg/dL — ABNORMAL HIGH (ref 0.7–1.3)
EGFR: 35 mL/min/{1.73_m2} — AB (ref >90)
GLUCOSE: 336 mg/dL — AB (ref 70–140)
POTASSIUM: 5.2 meq/L — AB (ref 3.5–5.1)
SODIUM: 130 meq/L — AB (ref 136–145)
Total Bilirubin: 0.86 mg/dL (ref 0.20–1.20)
Total Protein: 7.3 g/dL (ref 6.4–8.3)

## 2015-12-25 MED ORDER — GADOBENATE DIMEGLUMINE 529 MG/ML IV SOLN
9.0000 mL | Freq: Once | INTRAVENOUS | Status: AC | PRN
  Administered 2015-12-25: 9 mL via INTRAVENOUS

## 2015-12-25 MED ORDER — HEPARIN SOD (PORK) LOCK FLUSH 100 UNIT/ML IV SOLN
500.0000 [IU] | Freq: Once | INTRAVENOUS | Status: AC
Start: 2015-12-25 — End: 2015-12-25
  Administered 2015-12-25: 500 [IU] via INTRAVENOUS

## 2015-12-25 MED ORDER — SODIUM CHLORIDE 0.9% FLUSH
10.0000 mL | Freq: Once | INTRAVENOUS | Status: AC
  Administered 2015-12-25: 10 mL via INTRAVENOUS

## 2015-12-25 MED ORDER — HEPARIN SOD (PORK) LOCK FLUSH 100 UNIT/ML IV SOLN
500.0000 [IU] | INTRAVENOUS | Status: AC | PRN
  Administered 2015-12-25: 500 [IU]

## 2015-12-25 NOTE — Addendum Note (Signed)
Encounter addended by: Benn Moulder, RN on: 12/25/2015 11:12 AM<BR>    Actions taken: Pend clinical note, Delete clinical note

## 2015-12-25 NOTE — Progress Notes (Signed)
IV infusion 1 liter of NS completed.  VSS.  Port remains accessed since Mr. Christopher Clark will have an MRI with contrast at 12 noon today.  Accompanied by wife VSS

## 2015-12-25 NOTE — Addendum Note (Signed)
Encounter addended by: Benn Moulder, RN on: 12/25/2015 11:09 AM<BR>    Actions taken: Sign clinical note

## 2015-12-25 NOTE — Progress Notes (Signed)
Right chest port deaccessed, Flushed with heaprin 500 units

## 2015-12-25 NOTE — Progress Notes (Signed)
IV estalblished via right chest implanted port. Brisk blood return and flushed with NS without any resistance.  Infusion of 1 liter of 0.9 NS starting at 9:15am at a rate of .  No voiced concerns.  Accompanied by his spouse.  Sleeping off and on.  Increased rate of since he does not have any cardiac issues.

## 2015-12-25 NOTE — Progress Notes (Signed)
IV estalblished via right chest implanted port at 9:10am. Brisk blood return and flushed with NS without any resistance.  Infusion of 1 liter of 0.9 NS starting at 9:15am at a rate of .  No voiced concerns.  Accompanied by his spouse.  Sleeping off and on.  Increased rate of since he does not have any cardiac issues.

## 2015-12-25 NOTE — Progress Notes (Signed)
Radiation Oncology         [phone] ________________________________  Initial Outpatient Consultation  Name: Christopher Clark MRN: [id number]  Date: 12/25/2015  DOB: January 07, 1941  [id number], MD  Greig Right, MD   REFERRING PHYSICIAN: Greig Right, MD  DIAGNOSIS: Diagnoses of Brain metastasis [facility]) and Malignant neoplasm of pancreas, unspecified location of malignancy [facility]) were pertinent to this visit.   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 Brain metastasis (HCC) 198.3 C79.31   2. Malignant neoplasm of pancreas, unspecified location of malignancy ([place]) 157.9 C25.9     HISTORY OF PRESENT ILLNESS: Christopher Clark is a 75 y.o. male who was diagnosed with adenocarcinoma of the pancreas in June 2015. He is being treated with palliative chemotherapy (FOLFIRI - 30 cycles- chemo once every 8 weeks). The patient is on his 30th cycle of FOLFIRI. The patient never had surgery or radiation. In the past month, the patient developed a persistent frontal headache with difficulty with balance, nausea, and emesis. He denied vision changes at the time.  The patient had a CT of the neck/chest/abd/pelvis on 12/18/15 that revealed stable bilateral pulmonary nodules and a 1.4 cm residual soft tissue density in the pancreas similar to the previous exam that is likely treated tumor. CT of the head the same day showed a left lateral cerebellar mass, measuring 2.3 x 2.7, with mass effect and left to right shift on the fourth ventricle. Since his findings, the patient is taking  Decadron, and has tapered to 4mg  BID. He is scheduled to taper it down to 4 mg once a day starting Sunday.  The patient and his wife present today to discuss the role of radiation in the management of his disease.  PREVIOUS RADIATION THERAPY: No  PAST MEDICAL HISTORY:  Past Medical History:  Diagnosis Date  . Cancer ([place])    basal cell  carcinoma  . Diabetes mellitus without complication ([place])   . Dysrhythmia    hx afib  . Hypertension   . Hypocholesteremia       PAST SURGICAL HISTORY: Past Surgical History:  Procedure Laterality Date  . CHOLECYSTECTOMY    . EUS N/A 09/11/2013   Procedure: ESOPHAGEAL ENDOSCOPIC ULTRASOUND (EUS) RADIAL;  Surgeon: Arta Silence, MD;  Location: [facility] ENDOSCOPY;  Service: Endoscopy;  Laterality: N/A;  . FINE NEEDLE ASPIRATION N/A 09/11/2013   Procedure: FINE NEEDLE ASPIRATION (FNA) RADIAL;  Surgeon: Arta Silence, MD;  Location: [facility] ENDOSCOPY;  Service: Endoscopy;  Laterality: N/A;    FAMILY HISTORY: No family history on file.  SOCIAL HISTORY:  Social History   Social History  . Marital status: Married    Spouse name: N/A  . Number of children: N/A  . Years of education: N/A   Occupational History  . Not on file.   Social History Main Topics  . Smoking status: Never Smoker  . Smokeless tobacco: Never Used  . Alcohol use No  . Drug use: No  . Sexual activity: Not on file   Other Topics Concern  . Not on file   Social History Narrative  . No narrative on file    ALLERGIES: Review of patient's allergies indicates no known allergies.  MEDICATIONS:  Current Outpatient Prescriptions  Medication Sig Dispense Refill  . cholecalciferol (VITAMIN D) 1000 UNITS tablet Take 1,000 Units by mouth daily.    . Cinnamon 500 MG capsule Take 1,000 mg by mouth daily.    Marland Kitchen  Cyanocobalamin (VITAMIN B 12 PO) Take 1 tablet by mouth daily.    Marland Kitchen dexamethasone (DECADRON) 4 MG tablet Take 4 mg by mouth 2 (two) times daily with a meal.    . insulin glargine (LANTUS) 100 UNIT/ML injection Inject 35 Units into the skin at bedtime.    Marland Kitchen lisinopril (PRINIVIL,ZESTRIL) 20 MG tablet Take 20 mg by mouth every morning.     . lovastatin (MEVACOR) 20 MG tablet Take 20 mg by mouth daily.    . sitaGLIPtan-metformin (JANUMET) 50-500 MG per tablet Take 1 tablet by mouth 2 (two) times daily with a meal.    .  ferrous fumarate (HEMOCYTE - 106 MG FE) 325 (106 FE) MG TABS tablet Take 1 tablet by mouth daily.    . Ferrous Gluconate (IRON) 240 (27 FE) MG TABS Take 1 tablet by mouth daily.     No current facility-administered medications for this encounter.     REVIEW OF SYSTEMS:  On review of systems, the patient reports that he is doing well overall. He denies any chest pain, shortness of breath, cough, fevers, chills, night sweats, unintended weight changes. He denies any bowel or bladder disturbances, and denies abdominal pain. He denies any new musculoskeletal or joint aches or pains. He reports an unsteady gait, a dull headache, nausea, and emesis for the past month. He also reports a one month history of an unsteady gait. He denies changes in hearing or vision. He denies cognitive changes. A complete review of systems is obtained and is otherwise negative.   PHYSICAL EXAM:  height is 5\' 11"  (1.803 m) and weight is 168 lb 14.4 oz (76.6 kg). His oral temperature is 98 F (36.7 C). His blood pressure is 130/63 and his pulse is 69.   Pain Scale 0/10 In general this is a well appearing Caucasian male in no acute distress. He is alert and oriented x4 and appropriate throughout the examination. HEENT reveals that the patient is normocephalic, atraumatic. EOMs are intact. PERRLA. Skin is intact without any evidence of gross lesions. Cardiovascular exam reveals a regular rate and rhythm, no clicks rubs or murmurs are auscultated. Chest is clear to auscultation bilaterally. Lymphatic assessment is performed and does not reveal any adenopathy in the cervical, supraclavicular, axillary, or inguinal chains. Abdomen has active bowel sounds in all quadrants and is intact. The abdomen is soft, non tender, non distended. Lower extremities are negative for pretibial pitting edema, deep calf tenderness, cyanosis or clubbing. He is neurologically intact grossly with cranial nerves II-XII intact. Coordination intact, finger to  nose, strength is symmetric in 5/5 bilateral upper extremities, no pronator drift.  KPS = 90  100 - Normal; no complaints; no evidence of disease. 90   - Able to carry on normal activity; minor signs or symptoms of disease. 80   - Normal activity with effort; some signs or symptoms of disease. 56   - Cares for self; unable to carry on normal activity or to do active work. 60   - Requires occasional assistance, but is able to care for most of his personal needs. 50   - Requires considerable assistance and frequent medical care. 43   - Disabled; requires special care and assistance. 58   - Severely disabled; hospital admission is indicated although death not imminent. 33   - Very sick; hospital admission necessary; active supportive treatment necessary. 10   - Moribund; fatal processes progressing rapidly. 0     - Dead  Karnofsky DA, [place], [place]  LS and Burchenal JH (1948) The use of the nitrogen mustards in the palliative treatment of carcinoma: with particular reference to bronchogenic carcinoma Cancer 1 634-56  LABORATORY DATA:  Lab Results  Component Value Date   WBC 15.5 (H) 07/10/2012   HGB 13.3 07/10/2012   HCT 38.2 (L) 07/10/2012   MCV 84.1 07/10/2012   PLT 215 07/10/2012   Lab Results  Component Value Date   NA 130 (L) 12/25/2015   K 5.2 (H) 12/25/2015   CL 98 07/10/2012   CO2 19 (L) 12/25/2015   Lab Results  Component Value Date   ALT 31 12/25/2015   AST 15 12/25/2015   ALKPHOS 63 12/25/2015   BILITOT 0.86 12/25/2015     RADIOGRAPHY: No results found.    IMPRESSION:  1. 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  Dr. Tammi Klippel reviewed the findings and workup thus far with the patient. We discussed the dilemma regarding whole brain radiotherapy versus stereotactic radiosurgery. We discussed the pros and cons of each. We also discussed the logistics and delivery of each. We reviewed the results associated with  each of the treatments described above. The patient seems to understand the treatment options and would like to proceed with preoperative stereotactic radiosurgery. The patient is scheduled for [facility] CT simulation without contrast after this encounter and an MRI of the brain today. He is scheduled for surgery on 12/29/15 and SRS treatment afterwards. 2. Renal insufficiency/Dehydration. The patient is dehydrated with a BUN of 45 and a creatinine of 1.8. We will give a 1L bolus in leui of his imaging studies. He did take metformin this morning, and we will recheck his counts on Tuesday as well.  The above documentation reflects my direct findings during this shared patient visit. Please see the separate note by Dr. Tammi Klippel on this date for the remainder of the patient's plan of care.   Carola Rhine, PAC  This document serves as a record of services personally performed by Shona Simpson, PA-C and Dr. Tammi Klippel. It was created on their behalf by Darcus Austin, a trained medical scribe. The creation of this record is based on the scribe's personal observations and the providers' statements to them. This document has been checked and approved by the attending provider.

## 2015-12-25 NOTE — Addendum Note (Signed)
Encounter addended by: Benn Moulder, RN on: 12/25/2015 11:09 AM<BR>    Actions taken: Pend clinical note, Sign clinical note

## 2015-12-28 ENCOUNTER — Ambulatory Visit
Admission: RE | Admit: 2015-12-28 | Discharge: 2015-12-28 | Disposition: A | Discharge status: Home / Self Care | Payer: PPO | Source: Ambulatory Visit | Attending: Radiation Oncology | Admitting: Radiation Oncology

## 2015-12-28 DIAGNOSIS — Z510 Encounter for antineoplastic radiation therapy: Secondary | ICD-10-CM | POA: Diagnosis not present

## 2015-12-28 DIAGNOSIS — C7931 Secondary malignant neoplasm of brain: Secondary | ICD-10-CM

## 2015-12-28 NOTE — Progress Notes (Signed)
  Radiation Oncology         [phone] ________________________________  Name: Christopher Clark MRN: [id number]  Date: 12/28/2015  DOB: 1941/01/05  SIMULATION AND TREATMENT PLANNING NOTE    ICD-9-CM ICD-10-CM   1. Brain metastasis ([place]) 198.3 C79.31     DIAGNOSIS: 75 y.o. gentleman with metastatic adenocarcinoma of the pancreas with a 2.7 cm left lateral cerebellar metastasis  NARRATIVE:  The patient was brought to the [place].  Identity was confirmed.  All relevant records and images related to the planned course of therapy were reviewed.  The patient freely provided informed written consent to proceed with treatment after reviewing the details related to the planned course of therapy. The consent form was witnessed and verified by the simulation staff. Intravenous access was established for contrast administration. Then, the patient was set-up in a stable reproducible supine position for radiation therapy.  A relocatable thermoplastic stereotactic head frame was fabricated for precise immobilization.  CT images were obtained.  Surface markings were placed.  The CT images were loaded into the planning software and fused with the patient's targeting MRI scan.  Then the target and avoidance structures were contoured.  Treatment planning then occurred.  The radiation prescription was entered and confirmed.  I have requested 3D planning  I have requested a DVH of the following structures: Brain stem, brain, left eye, right eye, lenses, optic chiasm, target volumes, uninvolved brain, and normal tissue.    SPECIAL TREATMENT PROCEDURE:  The planned course of therapy using radiation constitutes a special treatment procedure. Special care is required in the management of this patient for the following reasons. This treatment constitutes a Special Treatment Procedure for the following reason: High dose per fraction requiring special monitoring for increased toxicities of treatment  including daily imaging.  The special nature of the planned course of radiotherapy will require increased physician supervision and oversight to ensure patient's safety with optimal treatment outcomes.  PLAN:  The patient will receive 14 Gy in 1 fraction followed by resection of the solitary brain met.  ________________________________  Sheral Apley Tammi Klippel, M.D.  This document serves as a record of services personally performed by Tyler Pita, MD. It was created on his behalf by Darcus Austin, a trained medical scribe. The creation of this record is based on the scribe's personal observations and the provider's statements to them. This document has been checked and approved by the attending provider.      This document serves as a record of services personally performed by Tyler Pita, MD. It was created on his behalf by Bethann Humble, a trained medical scribe. The creation of this record is based on the scribe's personal observations and the provider's statements to them. This document has been checked and approved by the attending provider.

## 2015-12-29 ENCOUNTER — Other Ambulatory Visit: Payer: Self-pay | Admitting: Neurosurgery

## 2015-12-29 ENCOUNTER — Other Ambulatory Visit: Payer: Self-pay

## 2015-12-29 ENCOUNTER — Encounter (HOSPITAL_COMMUNITY): Payer: Self-pay

## 2015-12-29 ENCOUNTER — Ambulatory Visit
Admission: RE | Admit: 2015-12-29 | Discharge: 2015-12-29 | Disposition: A | Discharge status: Home / Self Care | Payer: No Typology Code available for payment source | Source: Ambulatory Visit | Attending: Radiation Oncology | Admitting: Radiation Oncology

## 2015-12-29 ENCOUNTER — Encounter: Payer: Self-pay | Admitting: Radiation Oncology

## 2015-12-29 ENCOUNTER — Encounter (HOSPITAL_COMMUNITY)
Admission: RE | Admit: 2015-12-29 | Discharge: 2015-12-29 | Disposition: A | Discharge status: Home / Self Care | Payer: PPO | Source: Ambulatory Visit | Attending: Neurosurgery | Admitting: Neurosurgery

## 2015-12-29 ENCOUNTER — Ambulatory Visit
Admission: RE | Admit: 2015-12-29 | Discharge: 2015-12-29 | Disposition: A | Discharge status: Home / Self Care | Payer: PPO | Source: Ambulatory Visit | Attending: Radiation Oncology | Admitting: Radiation Oncology

## 2015-12-29 VITALS — BP 120/84 | HR 80 | Temp 98.1°F

## 2015-12-29 DIAGNOSIS — Z794 Long term (current) use of insulin: Secondary | ICD-10-CM | POA: Diagnosis not present

## 2015-12-29 DIAGNOSIS — C259 Malignant neoplasm of pancreas, unspecified: Secondary | ICD-10-CM

## 2015-12-29 DIAGNOSIS — E119 Type 2 diabetes mellitus without complications: Secondary | ICD-10-CM | POA: Diagnosis not present

## 2015-12-29 DIAGNOSIS — Z510 Encounter for antineoplastic radiation therapy: Secondary | ICD-10-CM | POA: Diagnosis not present

## 2015-12-29 DIAGNOSIS — Z79899 Other long term (current) drug therapy: Secondary | ICD-10-CM | POA: Diagnosis not present

## 2015-12-29 DIAGNOSIS — C7931 Secondary malignant neoplasm of brain: Secondary | ICD-10-CM

## 2015-12-29 DIAGNOSIS — E860 Dehydration: Secondary | ICD-10-CM

## 2015-12-29 DIAGNOSIS — Z981 Arthrodesis status: Secondary | ICD-10-CM | POA: Diagnosis not present

## 2015-12-29 DIAGNOSIS — Z8507 Personal history of malignant neoplasm of pancreas: Secondary | ICD-10-CM | POA: Diagnosis not present

## 2015-12-29 DIAGNOSIS — E1142 Type 2 diabetes mellitus with diabetic polyneuropathy: Secondary | ICD-10-CM | POA: Diagnosis not present

## 2015-12-29 DIAGNOSIS — I10 Essential (primary) hypertension: Secondary | ICD-10-CM | POA: Diagnosis not present

## 2015-12-29 HISTORY — DX: Personal history of other medical treatment: Z92.89

## 2015-12-29 HISTORY — DX: Benign neoplasm of brain, unspecified: D33.2

## 2015-12-29 HISTORY — DX: Unspecified cataract: H26.9

## 2015-12-29 HISTORY — DX: Polyneuropathy, unspecified: G62.9

## 2015-12-29 LAB — TYPE AND SCREEN
ABO/RH(D): A POS
ANTIBODY SCREEN: NEGATIVE

## 2015-12-29 LAB — CBC
HCT: 43.6 % (ref 39.0–52.0)
Hemoglobin: 14.6 g/dL (ref 13.0–17.0)
MCH: 30.2 pg (ref 26.0–34.0)
MCHC: 33.5 g/dL (ref 30.0–36.0)
MCV: 90.3 fL (ref 78.0–100.0)
PLATELETS: 237 10*3/uL (ref 150–400)
RBC: 4.83 MIL/uL (ref 4.22–5.81)
RDW: 14.7 % (ref 11.5–15.5)
WBC: 19 10*3/uL — ABNORMAL HIGH (ref 4.0–10.5)

## 2015-12-29 LAB — BASIC METABOLIC PANEL
ANION GAP: 16 — AB (ref 5–15)
BUN: 39 mg/dL — AB (ref 6–20)
CALCIUM: 8.8 mg/dL — AB (ref 8.9–10.3)
CO2: 17 mmol/L — ABNORMAL LOW (ref 22–32)
Chloride: 93 mmol/L — ABNORMAL LOW (ref 101–111)
Creatinine, Ser: 1.69 mg/dL — ABNORMAL HIGH (ref 0.61–1.24)
GFR calc Af Amer: 44 mL/min — ABNORMAL LOW (ref >60)
GFR, EST NON AFRICAN AMERICAN: 38 mL/min — AB (ref >60)
GLUCOSE: 371 mg/dL — AB (ref 65–99)
Potassium: 4.6 mmol/L (ref 3.5–5.1)
SODIUM: 126 mmol/L — AB (ref 135–145)

## 2015-12-29 LAB — ABO/RH: ABO/RH(D): A POS

## 2015-12-29 LAB — GLUCOSE, CAPILLARY: GLUCOSE-CAPILLARY: 329 mg/dL — AB (ref 65–99)

## 2015-12-29 NOTE — Procedures (Signed)
  Name: Christopher Clark  MRN: [id number]  Date: 12/29/2015   DOB: Oct 18, 1940  Stereotactic Radiosurgery Operative Note  PRE-OPERATIVE DIAGNOSIS:  Solitary Brain Metastasis  POST-OPERATIVE DIAGNOSIS:  Solitary Brain Metastasis  PROCEDURE:  Stereotactic Radiosurgery  SURGEON:  Eleri Ruben P, MD  NARRATIVE: The patient underwent a radiation treatment planning session in the radiation oncology simulation suite under the care of the radiation oncology physician and physicist.  I participated closely in the radiation treatment planning afterwards. The patient underwent planning CT which was fused to 3T high resolution MRI with 1 mm axial slices.  These images were fused on the planning system.  We contoured the gross target volumes and subsequently expanded this to yield the Planning Target Volume. I actively participated in the planning process.  I helped to define and review the target contours and also the contours of the optic pathway, eyes, brainstem and selected nearby organs at risk.  All the dose constraints for critical structures were reviewed and compared to AAPM Task Group 101.  The prescription dose conformity was reviewed.  I approved the plan electronically.    Accordingly, Christopher Clark was brought to the TrueBeam stereotactic radiation treatment linac and placed in the custom immobilization mask.  The patient was aligned according to the IR fiducial markers with BrainLab Exactrac, then orthogonal x-rays were used in ExacTrac with the 6DOF robotic table and the shifts were made to align the patient  Christopher Clark received stereotactic radiosurgery uneventfully.    The detailed description of the procedure is recorded in the radiation oncology procedure note.  I was present for the duration of the procedure.  DISPOSITION:  Following delivery, the patient was transported to nursing in stable condition and monitored for possible acute effects to be discharged to home in stable condition with  follow-up in one month.  Roseana Rhine P, MD 12/29/2015 4:51 PM

## 2015-12-29 NOTE — Pre-Procedure Instructions (Signed)
Christopher Clark  12/29/2015      [facility] [address] [place] [id number] [place] [id number] Phone: [phone] Fax: [phone]    Your procedure is scheduled on Wed, Sept 20 @ 11:50 AM  Report to [facility] Admitting at 9:50 AM  Call this number if you have problems the morning of surgery:  [phone]   Remember:  Do not eat food or drink liquids after midnight.      How to Manage Your Diabetes Before and After Surgery  Why is it important to control my blood sugar before and after surgery? . Improving blood sugar levels before and after surgery helps healing and can limit problems. . A way of improving blood sugar control is eating a healthy diet by: o  Eating less sugar and carbohydrates o  Increasing activity/exercise o  Talking with your doctor about reaching your blood sugar goals . High blood sugars (greater than 180 mg/dL) can raise your risk of infections and slow your recovery, so you will need to focus on controlling your diabetes during the weeks before surgery. . Make sure that the doctor who takes care of your diabetes knows about your planned surgery including the date and location.  How do I manage my blood sugar before surgery? . Check your blood sugar at least 4 times a day, starting 2 days before surgery, to make sure that the level is not too high or low. o Check your blood sugar the morning of your surgery when you wake up and every 2 hours until you get to the Short Stay unit. . If your blood sugar is less than 70 mg/dL, you will need to treat for low blood sugar: o Do not take insulin. o Treat a low blood sugar (less than 70 mg/dL) with  cup of clear juice (cranberry or apple), 4 glucose tablets, OR glucose gel. o Recheck blood sugar in 15 minutes after treatment (to make sure it is greater than 70 mg/dL). If your blood sugar is not greater than 70 mg/dL on recheck, call [phone] for  further instructions. . Report your blood sugar to the short stay nurse when you get to Short Stay.  . If you are admitted to the hospital after surgery: o Your blood sugar will be checked by the staff and you will probably be given insulin after surgery (instead of oral diabetes medicines) to make sure you have good blood sugar levels. o The goal for blood sugar control after surgery is 80-180 mg/dL.              WHAT DO I DO ABOUT MY DIABETES MEDICATION?   Marland Kitchen Do not take oral diabetes medicines (pills) the morning of surgery.  . THE NIGHT BEFORE SURGERY, take ___17________ units of _____Lantus______insulin.         . The day of surgery, do not take other diabetes injectables, including Byetta (exenatide), Bydureon (exenatide ER), Victoza (liraglutide), or Trulicity (dulaglutide).  . If your CBG is greater than 220 mg/dL, you may take  of your sliding scale (correction) dose of insulin.  Other Instructions:          Patient Signature:  Date:   Nurse Signature:  Date:   Reviewed and Endorsed by [facility] Patient Education Committee, August 2015   Do not wear jewelry.  Do not wear lotions, powders, colognes, or deoderant.   Men may shave face and neck.  Do  not bring valuables to the hospital.  [facility] is not responsible for any belongings or valuables.  Contacts, dentures or bridgework may not be worn into surgery.  Leave your suitcase in the car.  After surgery it may be brought to your room.  For patients admitted to the hospital, discharge time will be determined by your treatment team.  Patients discharged the day of surgery will not be allowed to drive home.    [facility] - Pre[facility]  Before surgery, you can play an important role.  Because skin is not sterile, your skin needs to be as free of germs as possible.  You can reduce the number of germs on you skin by washing with CHG (chlorahexidine gluconate) soap before  surgery.  CHG is an antiseptic cleaner which kills germs and bonds with the skin to continue killing germs even after washing.  Please DO NOT use if you have an allergy to CHG or antibacterial soaps.  If your skin becomes reddened/irritated stop using the CHG and inform your nurse when you arrive at Short Stay.  Do not shave (including legs and underarms) for at least 48 hours prior to the first CHG shower.  You may shave your face.  Please follow these instructions carefully:   1.  Shower with CHG Soap the night before surgery and the                                morning of Surgery.  2.  If you choose to wash your hair, wash your hair first as usual with your       normal shampoo.  3.  After you shampoo, rinse your hair and body thoroughly to remove the                      Shampoo.  4.  Use CHG as you would any other liquid soap.  You can apply chg directly       to the skin and wash gently with scrungie or a clean washcloth.  5.  Apply the CHG Soap to your body ONLY FROM THE NECK DOWN.        Do not use on open wounds or open sores.  Avoid contact with your eyes,       ears, mouth and genitals (private parts).  Wash genitals (private parts)       with your normal soap.  6.  Wash thoroughly, paying special attention to the area where your surgery        will be performed.  7.  Thoroughly rinse your body with warm water from the neck down.  8.  DO NOT shower/wash with your normal soap after using and rinsing off       the CHG Soap.  9.  Pat yourself dry with a clean towel.            10.  Wear clean pajamas.            11.  Place clean sheets on your bed the night of your first shower and do not        sleep with pets.  Day of Surgery  Do not apply any lotions/deoderants the morning of surgery.  Please wear clean clothes to the hospital/surgery center.    Please read over the following fact sheets that you were given. Pain Booklet and Coughing and Deep Breathing

## 2015-12-29 NOTE — Progress Notes (Signed)
Cardiologist denies  Medical Md is Dr.Pamela Penner  Stress test and echo in epic from 2005  Heart cath denies  EKG and CXR denies in past yr

## 2015-12-29 NOTE — Progress Notes (Signed)
Anesthesia chart review: Patient is a 75 year old male scheduled for craniotomy suboccipital for left cerebellar metastasis, application of cranial navigation on 12/30/2015 by Dr. Saintclair Halsted. PAT was at 2 PM on 12/29/15.  History includes nonsmoker, pancreatic cancer (diagnosed 2015), basal cell cancer, hypercholesterolemia, hypertension, peripheral neuropathy, diabetes mellitus type 2, peripheral neuropathy, cholecystectomy, cervical fusion. Had SRS today by Dr. Tammi Klippel with plans for craniectomy tomorrow.  PCP is Dr. Greig Right. RAD-ONC is Dr. Tammi Klippel. HEM-ONC is Dr. Bobby Rumpf.  Meds include dexamethasone, Lantus, lisinopril, lovastatin, metformin.  BP (!) 97/53   Pulse 76   Temp 36.6 C (Oral)   Resp 18   Ht 5\' 11"  (1.803 m)   Wt 167 lb 5 oz (75.9 kg)   SpO2 99%   BMI 23.34 kg/m  CBG 329 at PAT.  12/29/15 EKG: NSR. Notched p waves.   02/25/10 Stress echo ([facility], Care Everywhere. Done due to near syncope episode with possible ST elevation, ruled out MI): WALL SEGMENT CHANGES ---------------------------------------------------------    RestStress  --------------- --------------- Anterior Septum:  NormalHyperkinetic Anterior Wall:   NormalHyperkinetic Lateral Wall:   NormalHyperkinetic Posterior Wall:  NormalHyperkinetic Inferior Wall:   NormalHyperkinetic Inferior Septum:  NormalHyperkinetic Apex:    NormalHyperkinetic  MR: No MR N/A N/A N/A LVOT Vel: LVOT Jet: N/A ADDITIONAL FINDINGS ---------------------------------------------------------- Chest Discomfort: None Arrhythmia: None Termination Reason: Fatigue Adverse Effects: None and None Complications: None STRESS ECG RESULTS ----------------------------------------------------------- ECG Results: POSITIVE, 1 mm LATERAL ST-SEGMENT DEPRESSION INTERPRETATION  --------------------------------------------------------------- Interpretation: ABNORMAL STRESS ECHOCARDIOGRAM. Note: Diastolic Dysfunction Grade 1 Positive ECG, no wall motion abnormalities seen at peak stress Nelva Bush, Theresa P.A -C Created: 02/25/2010 1656 Last Entry: 1657 MD Note: Per Josh echo tech normal echo, no wall motion abnormality, ST depression 1 mm lateral leads)  12/25/15 MRI Brain: IMPRESSION: Solitary LEFT cerebellar intra-axial enhancing lesion favored to represent a metastasis. Brain lab protocol technique performed to provide more accurate intraoperative localization. Tumor today measures approximately 17 x 20 x 17 mm.  12/16/15 CT chest/abd/pelvis [facility]): Impression: 1. Bilateral pulmonary nodules are unchanged compared to 10/05/2014. 2. No evidence of recurrent liver metastasis. 3. Stable upper abdominal lymph nodes. 4. Stable area of soft tissue density within the celiac axis. Likely treated tumor.  12/29/15 labs showed NA 126 (down from 130 on 12/25/15), K 4.6, BUN 39, Cr 1.69 (previously 1.8 on 12/25/15), glucose 371. Anion gap 16. WBC 19.1, H/H 14.6/43.6, PLT 237K. A1c is pending. He reported typical fasting CBG 120-180's. T&S done. He has been on Decadron for over a week which could be contributing to his hyperglycemia and leukocytosis. Hyponatremia is worse, will plan to recheck BMET on arrival. PAT RN already called abnormal labs to Dr. Windy Carina office.  Reviewed above with anesthesiologist Dr. Lamarr Lulas. Patient with brain tumor, likely metastatic pancreatic cancer. He needs tumor resection. No CV symptoms documented from his PAT visit. He does have hyperglycemia, hyponatremia, renal insufficiency and leukocytosis by labs. Reportedly fasting CBG running < 200.  Will need to see if NA and glucose are stable prior to proceeding. Anesthesiologist can review tomorrow's results and discuss with Dr. Saintclair Halsted as indicated. Definitive plan following tomorrow's  anesthesiologist's evaluation.   George Hugh [facility] Short Stay Center/Anesthesiology Phone [phone] 12/29/2015 4:48 PM

## 2015-12-29 NOTE — Progress Notes (Signed)
Notified Vanessa with Dr.Cram of WBC 19 and Na 126.Lorriane Shire will pass this on to the MD

## 2015-12-29 NOTE — Progress Notes (Signed)
  Radiation Oncology         [phone] ________________________________  Stereotactic Treatment Procedure Note  Name: WANYE ZHENG MRN: [id number]  Date: 12/29/2015  DOB: [birth year]  SPECIAL TREATMENT PROCEDURE    ICD-9-CM ICD-10-CM   1. Brain metastasis ([place]) 198.3 C79.31     3D TREATMENT PLANNING AND DOSIMETRY:  The patient's radiation plan was reviewed and approved by neurosurgery and radiation oncology prior to treatment.  It showed 3-dimensional radiation distributions overlaid onto the planning CT/MRI image set.  The [facility] for the target structures as well as the organs at risk were reviewed. The documentation of the 3D plan and dosimetry are filed in the radiation oncology EMR.  NARRATIVE:  KAMI ROTAR was brought to the TrueBeam stereotactic radiation treatment machine and placed supine on the CT couch. The head frame was applied, and the patient was set up for stereotactic radiosurgery.  Neurosurgery was present for the set-up and delivery  SIMULATION VERIFICATION:  In the couch zero-angle position, the patient underwent Exactrac imaging using the Brainlab system with orthogonal KV images.  These were carefully aligned and repeated to confirm treatment position for each of the isocenters.  The Exactrac snap film verification was repeated at each couch angle.  PROCEDURE: Steffanie Dunn received stereotactic radiosurgery to the following targets: Left cerebellar 20 mm target was treated using 4 Dynamic Conformal Arcs to a prescription dose of 16 Gy.  ExacTrac registration was performed for each couch angle.  The 81.3% isodose line was prescribed.  6 MV X-rays were delivered in the flattening filter free beam mode.  STEREOTACTIC TREATMENT MANAGEMENT:  Following delivery, the patient was transported to nursing in stable condition and monitored for possible acute effects.  Vital signs were recorded BP 120/84   Pulse 80   Temp 98.1 F (36.7 C)   SpO2 98% Comment: room air. The  patient tolerated treatment without significant acute effects, and was discharged to home in stable condition.    PLAN: The patient will proceed to suboccipital craniectomy with image-guided metastectomy tomorrow with Dr. Saintclair Halsted and follow-up in one month.  ________________________________  Sheral Apley. Tammi Klippel, M.D.

## 2015-12-29 NOTE — Progress Notes (Signed)
Mr. Sestito presents after his [place] treatment. He denies pain. He denies nausea or a headache. He is alert and oriented and denies any concerns at this time.   BP 120/84   Pulse 80   Temp 98.1 F (36.7 C)   SpO2 98% Comment: room air

## 2015-12-30 ENCOUNTER — Inpatient Hospital Stay (HOSPITAL_COMMUNITY)
Admission: RE | Admit: 2015-12-30 | Discharge: 2016-01-01 | DRG: 027 | Disposition: A | Discharge status: Home / Self Care | Payer: PPO | Source: Ambulatory Visit | Attending: Neurosurgery | Admitting: Neurosurgery

## 2015-12-30 ENCOUNTER — Encounter (HOSPITAL_COMMUNITY)
Admission: RE | Disposition: A | Discharge status: Home / Self Care | Payer: Self-pay | Source: Ambulatory Visit | Attending: Neurosurgery

## 2015-12-30 ENCOUNTER — Encounter (HOSPITAL_COMMUNITY): Payer: Self-pay | Admitting: Anesthesiology

## 2015-12-30 ENCOUNTER — Inpatient Hospital Stay (HOSPITAL_COMMUNITY): Payer: PPO | Admitting: Vascular Surgery

## 2015-12-30 ENCOUNTER — Other Ambulatory Visit: Payer: Self-pay | Admitting: Neurosurgery

## 2015-12-30 DIAGNOSIS — C801 Malignant (primary) neoplasm, unspecified: Secondary | ICD-10-CM | POA: Diagnosis not present

## 2015-12-30 DIAGNOSIS — I10 Essential (primary) hypertension: Secondary | ICD-10-CM | POA: Diagnosis not present

## 2015-12-30 DIAGNOSIS — Z79899 Other long term (current) drug therapy: Secondary | ICD-10-CM | POA: Diagnosis not present

## 2015-12-30 DIAGNOSIS — Z794 Long term (current) use of insulin: Secondary | ICD-10-CM | POA: Diagnosis not present

## 2015-12-30 DIAGNOSIS — C7931 Secondary malignant neoplasm of brain: Secondary | ICD-10-CM | POA: Diagnosis not present

## 2015-12-30 DIAGNOSIS — Z981 Arthrodesis status: Secondary | ICD-10-CM | POA: Diagnosis not present

## 2015-12-30 DIAGNOSIS — Z8507 Personal history of malignant neoplasm of pancreas: Secondary | ICD-10-CM | POA: Diagnosis not present

## 2015-12-30 DIAGNOSIS — E1142 Type 2 diabetes mellitus with diabetic polyneuropathy: Secondary | ICD-10-CM | POA: Diagnosis not present

## 2015-12-30 DIAGNOSIS — E119 Type 2 diabetes mellitus without complications: Secondary | ICD-10-CM | POA: Diagnosis not present

## 2015-12-30 DIAGNOSIS — R51 Headache: Secondary | ICD-10-CM | POA: Diagnosis not present

## 2015-12-30 HISTORY — PX: APPLICATION OF CRANIAL NAVIGATION: SHX6578

## 2015-12-30 HISTORY — PX: CRANIOTOMY: SHX93

## 2015-12-30 LAB — CBC
HCT: 34.6 % — ABNORMAL LOW (ref 39.0–52.0)
Hemoglobin: 11.7 g/dL — ABNORMAL LOW (ref 13.0–17.0)
MCH: 29.7 pg (ref 26.0–34.0)
MCHC: 33.8 g/dL (ref 30.0–36.0)
MCV: 87.8 fL (ref 78.0–100.0)
Platelets: 147 10*3/uL — ABNORMAL LOW (ref 150–400)
RBC: 3.94 MIL/uL — ABNORMAL LOW (ref 4.22–5.81)
RDW: 14.9 % (ref 11.5–15.5)
WBC: 18 10*3/uL — ABNORMAL HIGH (ref 4.0–10.5)

## 2015-12-30 LAB — BASIC METABOLIC PANEL
Anion gap: 13 (ref 5–15)
Anion gap: 4 — ABNORMAL LOW (ref 5–15)
BUN: 33 mg/dL — AB (ref 6–20)
BUN: 30 mg/dL — AB (ref 6–20)
CALCIUM: 7.8 mg/dL — AB (ref 8.9–10.3)
CO2: 21 mmol/L — ABNORMAL LOW (ref 22–32)
CO2: 23 mmol/L (ref 22–32)
CREATININE: 1.33 mg/dL — AB (ref 0.61–1.24)
CREATININE: 1.21 mg/dL (ref 0.61–1.24)
Calcium: 8.7 mg/dL — ABNORMAL LOW (ref 8.9–10.3)
Chloride: 97 mmol/L — ABNORMAL LOW (ref 101–111)
Chloride: 101 mmol/L (ref 101–111)
GFR calc Af Amer: 59 mL/min — ABNORMAL LOW (ref >60)
GFR calc Af Amer: >60 mL/min (ref >60)
GFR calc non Af Amer: 57 mL/min — ABNORMAL LOW (ref >60)
GFR, EST NON AFRICAN AMERICAN: 51 mL/min — AB (ref >60)
GLUCOSE: 158 mg/dL — AB (ref 65–99)
GLUCOSE: 229 mg/dL — AB (ref 65–99)
POTASSIUM: 4.2 mmol/L (ref 3.5–5.1)
Potassium: 5 mmol/L (ref 3.5–5.1)
SODIUM: 131 mmol/L — AB (ref 135–145)
Sodium: 128 mmol/L — ABNORMAL LOW (ref 135–145)

## 2015-12-30 LAB — HEMOGLOBIN A1C
HEMOGLOBIN A1C: 8.1 % — AB (ref 4.8–5.6)
Mean Plasma Glucose: 186 mg/dL

## 2015-12-30 LAB — GLUCOSE, CAPILLARY
GLUCOSE-CAPILLARY: 151 mg/dL — AB (ref 65–99)
Glucose-Capillary: 188 mg/dL — ABNORMAL HIGH (ref 65–99)
Glucose-Capillary: 228 mg/dL — ABNORMAL HIGH (ref 65–99)

## 2015-12-30 LAB — MRSA PCR SCREENING: MRSA by PCR: NEGATIVE

## 2015-12-30 SURGERY — CRANIOTOMY TUMOR EXCISION
Anesthesia: General

## 2015-12-30 MED ORDER — METFORMIN HCL 500 MG PO TABS
1000.0000 mg | ORAL_TABLET | Freq: Every day | ORAL | Status: DC
  Administered 2015-12-31 – 2016-01-01 (×2): 1000 mg via ORAL
  Filled 2015-12-30 (×2): qty 2

## 2015-12-30 MED ORDER — BACITRACIN 50000 UNITS IM SOLR
INTRAMUSCULAR | Status: DC | PRN
  Administered 2015-12-30: 15:00:00

## 2015-12-30 MED ORDER — ACETAMINOPHEN 10 MG/ML IV SOLN
INTRAVENOUS | Status: AC
  Filled 2015-12-30: qty 100

## 2015-12-30 MED ORDER — LIDOCAINE HCL (CARDIAC) 20 MG/ML IV SOLN
INTRAVENOUS | Status: DC | PRN
  Administered 2015-12-30: 50 mg via INTRAVENOUS
  Administered 2015-12-30: 10 mg via INTRAVENOUS

## 2015-12-30 MED ORDER — MEPERIDINE HCL 25 MG/ML IJ SOLN
6.2500 mg | INTRAMUSCULAR | Status: DC | PRN
Start: 2015-12-30 — End: 2015-12-30

## 2015-12-30 MED ORDER — THROMBIN 20000 UNITS EX SOLR
CUTANEOUS | Status: DC | PRN
  Administered 2015-12-30: 15:00:00 via TOPICAL

## 2015-12-30 MED ORDER — INSULIN ASPART 100 UNIT/ML ~~LOC~~ SOLN
0.0000 [IU] | Freq: Three times a day (TID) | SUBCUTANEOUS | Status: DC
  Administered 2015-12-31 (×3): 3 [IU] via SUBCUTANEOUS
  Administered 2016-01-01: 8 [IU] via SUBCUTANEOUS

## 2015-12-30 MED ORDER — HYDROMORPHONE HCL 1 MG/ML IJ SOLN
0.2500 mg | INTRAMUSCULAR | Status: DC | PRN
  Administered 2015-12-30 (×4): 0.5 mg via INTRAVENOUS

## 2015-12-30 MED ORDER — HEMOSTATIC AGENTS (NO CHARGE) OPTIME
TOPICAL | Status: DC | PRN
  Administered 2015-12-30: 1 via TOPICAL

## 2015-12-30 MED ORDER — PHENYLEPHRINE 40 MCG/ML (10ML) SYRINGE FOR IV PUSH (FOR BLOOD PRESSURE SUPPORT)
PREFILLED_SYRINGE | INTRAVENOUS | Status: AC
  Filled 2015-12-30: qty 10

## 2015-12-30 MED ORDER — PHENYLEPHRINE HCL 10 MG/ML IJ SOLN
INTRAVENOUS | Status: DC | PRN
  Administered 2015-12-30: 50 ug/min via INTRAVENOUS

## 2015-12-30 MED ORDER — ONDANSETRON HCL 4 MG/2ML IJ SOLN: 4.0000 mg | INTRAMUSCULAR | Status: DC | PRN

## 2015-12-30 MED ORDER — DEXAMETHASONE SODIUM PHOSPHATE 4 MG/ML IJ SOLN
4.0000 mg | Freq: Four times a day (QID) | INTRAMUSCULAR | Status: DC
  Administered 2015-12-31 – 2016-01-01 (×3): 4 mg via INTRAVENOUS
  Filled 2015-12-30 (×3): qty 1

## 2015-12-30 MED ORDER — CEFAZOLIN SODIUM-DEXTROSE 2-3 GM-% IV SOLR
INTRAVENOUS | Status: DC | PRN
  Administered 2015-12-30: 2 g via INTRAVENOUS

## 2015-12-30 MED ORDER — INSULIN ASPART 100 UNIT/ML ~~LOC~~ SOLN
4.0000 [IU] | Freq: Three times a day (TID) | SUBCUTANEOUS | Status: DC
  Administered 2015-12-31 – 2016-01-01 (×4): 4 [IU] via SUBCUTANEOUS

## 2015-12-30 MED ORDER — INSULIN GLARGINE 100 UNIT/ML ~~LOC~~ SOLN
35.0000 [IU] | Freq: Every day | SUBCUTANEOUS | Status: DC
  Administered 2015-12-30 – 2015-12-31 (×2): 35 [IU] via SUBCUTANEOUS
  Filled 2015-12-30 (×3): qty 0.35

## 2015-12-30 MED ORDER — PROPOFOL 10 MG/ML IV BOLUS
INTRAVENOUS | Status: DC | PRN
  Administered 2015-12-30: 10 mg via INTRAVENOUS
  Administered 2015-12-30: 150 mg via INTRAVENOUS

## 2015-12-30 MED ORDER — LACTATED RINGERS IV SOLN
INTRAVENOUS | Status: DC | PRN
  Administered 2015-12-30: 13:00:00 via INTRAVENOUS

## 2015-12-30 MED ORDER — ONDANSETRON HCL 4 MG/2ML IJ SOLN
INTRAMUSCULAR | Status: AC
  Filled 2015-12-30: qty 2

## 2015-12-30 MED ORDER — PRAVASTATIN SODIUM 10 MG PO TABS
10.0000 mg | ORAL_TABLET | Freq: Every day | ORAL | Status: DC
  Administered 2015-12-31: 10 mg via ORAL
  Filled 2015-12-30: qty 1

## 2015-12-30 MED ORDER — BACITRACIN ZINC 500 UNIT/GM EX OINT
TOPICAL_OINTMENT | CUTANEOUS | Status: DC | PRN
  Administered 2015-12-30: 1 via TOPICAL

## 2015-12-30 MED ORDER — LACTATED RINGERS IV SOLN
INTRAVENOUS | Status: DC
  Administered 2015-12-30 (×2): via INTRAVENOUS

## 2015-12-30 MED ORDER — LIDOCAINE 2% (20 MG/ML) 5 ML SYRINGE
INTRAMUSCULAR | Status: AC
  Filled 2015-12-30: qty 5

## 2015-12-30 MED ORDER — FENTANYL CITRATE (PF) 100 MCG/2ML IJ SOLN
INTRAMUSCULAR | Status: DC | PRN
  Administered 2015-12-30 (×2): 50 ug via INTRAVENOUS
  Administered 2015-12-30: 100 ug via INTRAVENOUS

## 2015-12-30 MED ORDER — SUGAMMADEX SODIUM 200 MG/2ML IV SOLN
INTRAVENOUS | Status: DC | PRN
  Administered 2015-12-30: 150 mg via INTRAVENOUS

## 2015-12-30 MED ORDER — HYDRALAZINE HCL 20 MG/ML IJ SOLN
5.0000 mg | Freq: Once | INTRAMUSCULAR | Status: AC
  Administered 2015-12-30: 5 mg via INTRAVENOUS

## 2015-12-30 MED ORDER — PROMETHAZINE HCL 25 MG/ML IJ SOLN: 6.2500 mg | INTRAMUSCULAR | Status: DC | PRN

## 2015-12-30 MED ORDER — SUGAMMADEX SODIUM 200 MG/2ML IV SOLN
INTRAVENOUS | Status: AC
  Filled 2015-12-30: qty 2

## 2015-12-30 MED ORDER — FENTANYL CITRATE (PF) 100 MCG/2ML IJ SOLN
INTRAMUSCULAR | Status: AC
  Filled 2015-12-30: qty 4

## 2015-12-30 MED ORDER — LABETALOL HCL 5 MG/ML IV SOLN
INTRAVENOUS | Status: DC | PRN
  Administered 2015-12-30 (×2): 10 mg via INTRAVENOUS

## 2015-12-30 MED ORDER — DEXAMETHASONE SODIUM PHOSPHATE 4 MG/ML IJ SOLN
4.0000 mg | Freq: Three times a day (TID) | INTRAMUSCULAR | Status: DC
Start: 2016-01-01 — End: 2016-01-01

## 2015-12-30 MED ORDER — PROMETHAZINE HCL 25 MG PO TABS
12.5000 mg | ORAL_TABLET | ORAL | Status: DC | PRN
Start: 2015-12-30 — End: 2016-01-01

## 2015-12-30 MED ORDER — ONDANSETRON HCL 4 MG/2ML IJ SOLN
INTRAMUSCULAR | Status: DC | PRN
  Administered 2015-12-30: 4 mg via INTRAVENOUS

## 2015-12-30 MED ORDER — ROCURONIUM BROMIDE 100 MG/10ML IV SOLN
INTRAVENOUS | Status: DC | PRN
  Administered 2015-12-30: 50 mg via INTRAVENOUS

## 2015-12-30 MED ORDER — 0.9 % SODIUM CHLORIDE (POUR BTL) OPTIME
TOPICAL | Status: DC | PRN
  Administered 2015-12-30 (×3): 1000 mL

## 2015-12-30 MED ORDER — DEXAMETHASONE 4 MG PO TABS
4.0000 mg | ORAL_TABLET | Freq: Two times a day (BID) | ORAL | Status: DC
  Administered 2015-12-31 – 2016-01-01 (×3): 4 mg via ORAL
  Filled 2015-12-30 (×3): qty 1

## 2015-12-30 MED ORDER — DEXAMETHASONE SODIUM PHOSPHATE 10 MG/ML IJ SOLN
INTRAMUSCULAR | Status: DC | PRN
  Administered 2015-12-30: 10 mg via INTRAVENOUS

## 2015-12-30 MED ORDER — LABETALOL HCL 5 MG/ML IV SOLN
10.0000 mg | INTRAVENOUS | Status: DC | PRN
  Administered 2015-12-30: 5 mg via INTRAVENOUS

## 2015-12-30 MED ORDER — LABETALOL HCL 5 MG/ML IV SOLN
INTRAVENOUS | Status: AC
  Filled 2015-12-30: qty 4

## 2015-12-30 MED ORDER — LACTATED RINGERS IV SOLN: INTRAVENOUS | Status: DC

## 2015-12-30 MED ORDER — ONDANSETRON HCL 4 MG PO TABS: 4.0000 mg | ORAL_TABLET | ORAL | Status: DC | PRN

## 2015-12-30 MED ORDER — SODIUM CHLORIDE 0.9 % IV SOLN
INTRAVENOUS | Status: DC | PRN
  Administered 2015-12-30: 14:00:00 via INTRAVENOUS

## 2015-12-30 MED ORDER — INSULIN ASPART 100 UNIT/ML ~~LOC~~ SOLN: 0.0000 [IU] | Freq: Every day | SUBCUTANEOUS | Status: DC

## 2015-12-30 MED ORDER — HYDRALAZINE HCL 20 MG/ML IJ SOLN
5.0000 mg | INTRAMUSCULAR | Status: DC | PRN
  Administered 2015-12-30: 5 mg via INTRAVENOUS

## 2015-12-30 MED ORDER — FAMOTIDINE IN NACL 20-0.9 MG/50ML-% IV SOLN
20.0000 mg | Freq: Two times a day (BID) | INTRAVENOUS | Status: DC
  Administered 2015-12-30 – 2015-12-31 (×3): 20 mg via INTRAVENOUS
  Filled 2015-12-30 (×3): qty 50

## 2015-12-30 MED ORDER — METFORMIN HCL 500 MG PO TABS
500.0000 mg | ORAL_TABLET | Freq: Every day | ORAL | Status: DC
  Administered 2015-12-31: 500 mg via ORAL
  Filled 2015-12-30: qty 1

## 2015-12-30 MED ORDER — ROCURONIUM BROMIDE 10 MG/ML (PF) SYRINGE
PREFILLED_SYRINGE | INTRAVENOUS | Status: AC
  Filled 2015-12-30: qty 10

## 2015-12-30 MED ORDER — SODIUM CHLORIDE 0.9 % IV SOLN
500.0000 mg | Freq: Two times a day (BID) | INTRAVENOUS | Status: DC
  Administered 2015-12-30 – 2016-01-01 (×4): 500 mg via INTRAVENOUS
  Filled 2015-12-30 (×7): qty 5

## 2015-12-30 MED ORDER — CEFAZOLIN SODIUM-DEXTROSE 2-4 GM/100ML-% IV SOLN
INTRAVENOUS | Status: AC
  Filled 2015-12-30: qty 100

## 2015-12-30 MED ORDER — DEXAMETHASONE SODIUM PHOSPHATE 10 MG/ML IJ SOLN
INTRAMUSCULAR | Status: AC
  Administered 2015-12-30: 20:00:00
  Filled 2015-12-30: qty 1

## 2015-12-30 MED ORDER — VITAMIN D 1000 UNITS PO TABS
1000.0000 [IU] | ORAL_TABLET | Freq: Every day | ORAL | Status: DC
  Administered 2015-12-30 – 2016-01-01 (×3): 1000 [IU] via ORAL
  Filled 2015-12-30 (×3): qty 1

## 2015-12-30 MED ORDER — DEXAMETHASONE SODIUM PHOSPHATE 10 MG/ML IJ SOLN
6.0000 mg | Freq: Four times a day (QID) | INTRAMUSCULAR | Status: AC
  Administered 2015-12-30 – 2015-12-31 (×4): 6 mg via INTRAVENOUS
  Filled 2015-12-30 (×3): qty 1

## 2015-12-30 MED ORDER — LISINOPRIL 10 MG PO TABS
10.0000 mg | ORAL_TABLET | Freq: Every morning | ORAL | Status: DC
  Administered 2015-12-31 – 2016-01-01 (×2): 10 mg via ORAL
  Filled 2015-12-30: qty 1
  Filled 2015-12-30: qty 2

## 2015-12-30 MED ORDER — PROPOFOL 10 MG/ML IV BOLUS
INTRAVENOUS | Status: AC
  Filled 2015-12-30: qty 20

## 2015-12-30 MED ORDER — LIDOCAINE-EPINEPHRINE 1 %-1:100000 IJ SOLN
INTRAMUSCULAR | Status: DC | PRN
  Administered 2015-12-30: 4 mL

## 2015-12-30 MED ORDER — HYDRALAZINE HCL 20 MG/ML IJ SOLN
INTRAMUSCULAR | Status: AC
  Filled 2015-12-30: qty 1

## 2015-12-30 MED ORDER — LABETALOL HCL 5 MG/ML IV SOLN: 5.0000 mg | INTRAVENOUS | Status: DC | PRN

## 2015-12-30 MED ORDER — ACETAMINOPHEN 10 MG/ML IV SOLN
INTRAVENOUS | Status: DC | PRN
  Administered 2015-12-30: 1000 mg via INTRAVENOUS

## 2015-12-30 MED ORDER — HYDROMORPHONE HCL 1 MG/ML IJ SOLN
INTRAMUSCULAR | Status: AC
  Filled 2015-12-30: qty 1

## 2015-12-30 SURGICAL SUPPLY — 84 items
BAG DECANTER FOR FLEXI CONT (MISCELLANEOUS) ×4 IMPLANT
BANDAGE GAUZE 4  KLING STR (GAUZE/BANDAGES/DRESSINGS) ×4 IMPLANT
BLADE CLIPPER SURG (BLADE) ×4 IMPLANT
BLADE SURG 11 STRL SS (BLADE) ×4 IMPLANT
BNDG COHESIVE 4X5 TAN NS LF (GAUZE/BANDAGES/DRESSINGS) IMPLANT
BUR ACORN 9.0 PRECISION (BURR) ×3 IMPLANT
BUR ACORN 9.0MM PRECISION (BURR) ×1
BUR SPIRAL ROUTER 2.3 (BUR) IMPLANT
BUR SPIRAL ROUTER 2.3MM (BUR)
CANISTER SUCT 3000ML PPV (MISCELLANEOUS) ×8 IMPLANT
CLIP TI MEDIUM 6 (CLIP) IMPLANT
CONT SPEC 4OZ CLIKSEAL STRL BL (MISCELLANEOUS) ×12 IMPLANT
COVER BACK TABLE 60X90IN (DRAPES) ×4 IMPLANT
DECANTER SPIKE VIAL GLASS SM (MISCELLANEOUS) ×4 IMPLANT
DERMABOND ADVANCED (GAUZE/BANDAGES/DRESSINGS) ×2
DERMABOND ADVANCED .7 DNX12 (GAUZE/BANDAGES/DRESSINGS) ×2 IMPLANT
DRAPE CAMERA VIDEO/LASER (DRAPES) IMPLANT
DRAPE MICROSCOPE LEICA (MISCELLANEOUS) IMPLANT
DRAPE NEUROLOGICAL W/INCISE (DRAPES) ×4 IMPLANT
DRAPE STERI IOBAN 125X83 (DRAPES) IMPLANT
DRAPE WARM FLUID 44X44 (DRAPE) ×4 IMPLANT
DRSG OPSITE 4X5.5 SM (GAUZE/BANDAGES/DRESSINGS) ×8 IMPLANT
ELECT CAUTERY BLADE 6.4 (BLADE) ×4 IMPLANT
ELECT REM PT RETURN 9FT ADLT (ELECTROSURGICAL) ×4
ELECTRODE REM PT RTRN 9FT ADLT (ELECTROSURGICAL) ×2 IMPLANT
FORCEPS BIPOLAR SPETZLER 8 1.0 (NEUROSURGERY SUPPLIES) ×4 IMPLANT
GAUZE SPONGE 4X4 12PLY STRL (GAUZE/BANDAGES/DRESSINGS) ×4 IMPLANT
GAUZE SPONGE 4X4 16PLY XRAY LF (GAUZE/BANDAGES/DRESSINGS) IMPLANT
GLOVE BIO SURGEON STRL SZ 6.5 (GLOVE) IMPLANT
GLOVE BIO SURGEON STRL SZ7 (GLOVE) IMPLANT
GLOVE BIO SURGEON STRL SZ7.5 (GLOVE) IMPLANT
GLOVE BIO SURGEON STRL SZ8 (GLOVE) ×4 IMPLANT
GLOVE BIO SURGEON STRL SZ8.5 (GLOVE) IMPLANT
GLOVE BIO SURGEONS STRL SZ 6.5 (GLOVE)
GLOVE BIOGEL M 8.0 STRL (GLOVE) IMPLANT
GLOVE ECLIPSE 6.5 STRL STRAW (GLOVE) IMPLANT
GLOVE ECLIPSE 7.0 STRL STRAW (GLOVE) IMPLANT
GLOVE ECLIPSE 7.5 STRL STRAW (GLOVE) ×8 IMPLANT
GLOVE ECLIPSE 8.0 STRL XLNG CF (GLOVE) ×4 IMPLANT
GLOVE ECLIPSE 8.5 STRL (GLOVE) IMPLANT
GLOVE EXAM NITRILE LRG STRL (GLOVE) IMPLANT
GLOVE EXAM NITRILE XL STR (GLOVE) IMPLANT
GLOVE EXAM NITRILE XS STR PU (GLOVE) IMPLANT
GLOVE INDICATOR 6.5 STRL GRN (GLOVE) IMPLANT
GLOVE INDICATOR 7.0 STRL GRN (GLOVE) IMPLANT
GLOVE INDICATOR 7.5 STRL GRN (GLOVE) ×4 IMPLANT
GLOVE INDICATOR 8.0 STRL GRN (GLOVE) IMPLANT
GLOVE INDICATOR 8.5 STRL (GLOVE) ×4 IMPLANT
GLOVE OPTIFIT SS 8.0 STRL (GLOVE) IMPLANT
GLOVE SURG SS PI 6.5 STRL IVOR (GLOVE) IMPLANT
GOWN STRL REUS W/ TWL LRG LVL3 (GOWN DISPOSABLE) ×2 IMPLANT
GOWN STRL REUS W/ TWL XL LVL3 (GOWN DISPOSABLE) ×2 IMPLANT
GOWN STRL REUS W/TWL 2XL LVL3 (GOWN DISPOSABLE) ×4 IMPLANT
GOWN STRL REUS W/TWL LRG LVL3 (GOWN DISPOSABLE) ×2
GOWN STRL REUS W/TWL XL LVL3 (GOWN DISPOSABLE) ×2
GRAFT DURAGEN MATRIX 2WX2L ×4 IMPLANT
HEMOSTAT SURGICEL 2X14 (HEMOSTASIS) ×4 IMPLANT
KIT BASIN OR (CUSTOM PROCEDURE TRAY) ×4 IMPLANT
KIT ROOM TURNOVER OR (KITS) ×4 IMPLANT
MARKER SPHERE PSV REFLC 13MM (MARKER) ×8 IMPLANT
NEEDLE HYPO 25X1 1.5 SAFETY (NEEDLE) ×4 IMPLANT
NS IRRIG 1000ML POUR BTL (IV SOLUTION) ×12 IMPLANT
PACK CRANIOTOMY (CUSTOM PROCEDURE TRAY) ×4 IMPLANT
PAD ARMBOARD 7.5X6 YLW CONV (MISCELLANEOUS) ×12 IMPLANT
PATTIES SURGICAL .25X.25 (GAUZE/BANDAGES/DRESSINGS) IMPLANT
PATTIES SURGICAL .5 X.5 (GAUZE/BANDAGES/DRESSINGS) ×8 IMPLANT
PATTIES SURGICAL .5 X3 (DISPOSABLE) ×8 IMPLANT
PATTIES SURGICAL 1X1 (DISPOSABLE) IMPLANT
RUBBERBAND STERILE (MISCELLANEOUS) IMPLANT
SPONGE NEURO XRAY DETECT 1X3 (DISPOSABLE) IMPLANT
SPONGE SURGIFOAM ABS GEL 100 (HEMOSTASIS) ×4 IMPLANT
SPONGE SURGIFOAM ABS GEL 12-7 (HEMOSTASIS) IMPLANT
STAPLER VISISTAT 35W (STAPLE) ×4 IMPLANT
SUT ETHILON 3 0 FSL (SUTURE) ×4 IMPLANT
SUT NURALON 4 0 TR CR/8 (SUTURE) ×4 IMPLANT
SUT VIC AB 2-0 CT1 18 (SUTURE) ×8 IMPLANT
SYR CONTROL 10ML LL (SYRINGE) ×4 IMPLANT
TOWEL OR 17X24 6PK STRL BLUE (TOWEL DISPOSABLE) ×4 IMPLANT
TOWEL OR 17X26 10 PK STRL BLUE (TOWEL DISPOSABLE) ×4 IMPLANT
TRAY FOLEY W/METER SILVER 16FR (SET/KITS/TRAYS/PACK) ×4 IMPLANT
TUBE CONNECTING 12'X1/4 (SUCTIONS)
TUBE CONNECTING 12X1/4 (SUCTIONS) IMPLANT
UNDERPAD 30X30 (UNDERPADS AND DIAPERS) ×4 IMPLANT
WATER STERILE IRR 1000ML POUR (IV SOLUTION) ×4 IMPLANT

## 2015-12-30 NOTE — Addendum Note (Signed)
Encounter addended by: Heywood Footman, RN on: 12/30/2015  9:58 AM<BR>    Actions taken: Charge Capture section accepted

## 2015-12-30 NOTE — H&P (Signed)
Christopher Clark is an 75 y.o. male.   Chief Complaint: Headaches HPI: Patient is a 75 year old woman who has had a history of pancreatic carcinoma and was noted to be having headaches and routine follow-up CT scan of his brain showed a left cerebellar mass further workup revealed this to be consistent with a left cerebellar metastasis. So patient was recommended stereotactic radiosurgery with subsequent craniotomy for removal of mass. Patient underwent irradiation yesterday and tolerated very well now presents for craniotomy for removal.  Past Medical History:  Diagnosis Date  . Brain tumor (benign) ([place])   . Cancer ([place])    basal cell carcinoma.Pancreas cancer  . Cataracts, bilateral   . Diabetes mellitus without complication ([place])    takes Lantus nightly and Metformin daily.Average fasting blood sugar 120-180  . History of blood transfusion    no abnormal reaction noted  . Hypertension    takes Lisinopril daily  . Hypocholesteremia    takes Lovastatin daily  . Peripheral neuropathy [facility])     Past Surgical History:  Procedure Laterality Date  . CERVICAL FUSION    . CHOLECYSTECTOMY    . COLONOSCOPY    . EUS N/A 09/11/2013   Procedure: ESOPHAGEAL ENDOSCOPIC ULTRASOUND (EUS) RADIAL;  Surgeon: Arta Silence, MD;  Location: [facility] ENDOSCOPY;  Service: Endoscopy;  Laterality: N/A;  . FINE NEEDLE ASPIRATION N/A 09/11/2013   Procedure: FINE NEEDLE ASPIRATION (FNA) RADIAL;  Surgeon: Arta Silence, MD;  Location: [facility] ENDOSCOPY;  Service: Endoscopy;  Laterality: N/A;    History reviewed. No pertinent family history. Social History:  reports that he has never smoked. He has never used smokeless tobacco. He reports that he does not drink alcohol or use drugs.  Allergies:  Allergies  Allergen Reactions  . No Known Allergies     Medications Prior to Admission  Medication Sig Dispense Refill  . cholecalciferol (VITAMIN D) 1000 UNITS tablet Take 1,000 Units by mouth daily.    . Cyanocobalamin  (VITAMIN B 12 PO) Take 1 tablet by mouth daily.    Marland Kitchen dexamethasone (DECADRON) 4 MG tablet Take 4 mg by mouth 2 (two) times daily with a meal.    . insulin glargine (LANTUS) 100 UNIT/ML injection Inject 35 Units into the skin at bedtime.    Marland Kitchen lisinopril (PRINIVIL,ZESTRIL) 20 MG tablet Take 10 mg by mouth every morning.     . lovastatin (MEVACOR) 20 MG tablet Take 20 mg by mouth daily.    . metFORMIN (GLUCOPHAGE) 500 MG tablet Take 500-1,000 mg by mouth 2 (two) times daily with a meal. Takes 1000 mg in the morning and 500 mg in the evening      Results for orders placed or performed during the hospital encounter of 12/30/15 (from the past 48 hour(s))  Glucose, capillary     Status: Abnormal   Collection Time: 12/30/15  9:51 AM  Result Value Ref Range   Glucose-Capillary 151 (H) 65 - 99 mg/dL  Basic metabolic panel     Status: Abnormal   Collection Time: 12/30/15 10:00 AM  Result Value Ref Range   Sodium 131 (L) 135 - 145 mmol/L   Potassium 4.2 3.5 - 5.1 mmol/L   Chloride 97 (L) 101 - 111 mmol/L   CO2 21 (L) 22 - 32 mmol/L   Glucose, Bld 158 (H) 65 - 99 mg/dL   BUN 33 (H) 6 - 20 mg/dL   Creatinine, Ser 1.33 (H) 0.61 - 1.24 mg/dL   Calcium 8.7 (L) 8.9 - 10.3 mg/dL  GFR calc non Af Amer 51 (L) >60 mL/min   GFR calc Af Amer 59 (L) >60 mL/min    Comment: (NOTE) The eGFR has been calculated using the CKD EPI equation. This calculation has not been validated in all clinical situations. eGFR's persistently <60 mL/min signify possible Chronic Kidney Disease.    Anion gap 13 5 - 15   No results found.  Review of Systems  Neurological: Positive for headaches.  All other systems reviewed and are negative.   Blood pressure (!) 143/77, pulse 72, temperature 98.9 F (37.2 C), temperature source Oral, resp. rate 18, height _0  (1.803 m), weight 75.9 kg (167 lb 5 oz), SpO2 98 %. Physical Exam  Constitutional: He is oriented to person, place, and time. He appears well-developed and  well-nourished.  HENT:  Head: Normocephalic.  Eyes: Pupils are equal, round, and reactive to light.  Neck: Normal range of motion.  GI: Soft. Bowel sounds are normal.  Neurological: He is alert and oriented to person, place, and time. He has normal strength. GCS eye subscore is 4. GCS verbal subscore is 5. GCS motor subscore is 6.  Patient is awake alert pupils equal cranial nerves are intact strength is 5 out of 5 some slight left-sided dysmetria  Skin: Skin is warm and dry.     Assessment/Plan 54 gentleman presents for suboccipital craniectomy with stereotactic navigation for resection of left cerebellar mass.  Christopher Emile P, MD 12/30/2015, 12:08 PM

## 2015-12-30 NOTE — Anesthesia Preprocedure Evaluation (Addendum)
Anesthesia Evaluation  Patient identified by MRN, date of birth, ID band Patient awake    Reviewed: Allergy & Precautions, NPO status , Patient's Chart, lab work & pertinent test results  Airway Mallampati: II  TM Distance: >3 FB Neck ROM: Full    Dental  (+) Dental Advisory Given, Teeth Intact   Pulmonary neg pulmonary ROS,    breath sounds clear to auscultation       Cardiovascular hypertension, Pt. on medications  Rhythm:Regular Rate:Normal     Neuro/Psych  Neuromuscular disease    GI/Hepatic negative GI ROS, Neg liver ROS,   Endo/Other  diabetes, Type 2, Insulin Dependent, Oral Hypoglycemic Agents  Renal/GU negative Renal ROS  negative genitourinary   Musculoskeletal negative musculoskeletal ROS (+)   Abdominal   Peds negative pediatric ROS (+)  Hematology negative hematology ROS (+)   Anesthesia Other Findings   Reproductive/Obstetrics negative OB ROS                            Lab Results  Component Value Date   WBC 19.0 (H) 12/29/2015   HGB 14.6 12/29/2015   HCT 43.6 12/29/2015   MCV 90.3 12/29/2015   PLT 237 12/29/2015   Lab Results  Component Value Date   CREATININE 1.33 (H) 12/30/2015   BUN 33 (H) 12/30/2015   NA 131 (L) 12/30/2015   K 4.2 12/30/2015   CL 97 (L) 12/30/2015   CO2 21 (L) 12/30/2015   No results found for: INR, PROTIME  12/2015 EKG: normal sinus rhythm.  Anesthesia Physical Anesthesia Plan  ASA: II  Anesthesia Plan: General   Post-op Pain Management:    Induction: Intravenous  Airway Management Planned: Oral ETT  Additional Equipment: Arterial line  Intra-op Plan:   Post-operative Plan: Extubation in OR  Informed Consent: I have reviewed the patients History and Physical, chart, labs and discussed the procedure including the risks, benefits and alternatives for the proposed anesthesia with the patient or authorized representative who has  indicated his/her understanding and acceptance.   Dental advisory given  Plan Discussed with: CRNA  Anesthesia Plan Comments:         Anesthesia Quick Evaluation

## 2015-12-30 NOTE — Progress Notes (Signed)
Pt has port in right chest that is being used for cancer treatment, not currently accessed.

## 2015-12-30 NOTE — Transfer of Care (Signed)
Immediate Anesthesia Transfer of Care Note  Patient: Christopher Clark  Procedure(s) Performed: Procedure(s) with comments: CRANIOTOMY SUBOCCIPITAL FOR LEFT CEREBELLAR METASTATIC (Left) - CRANIOTOMY SUBOCCIPITAL FOR LEFT CEREBELLA MET APPLICATION OF CRANIAL NAVIGATION (N/A)  Patient Location: PACU  Anesthesia Type:General  Level of Consciousness: awake, alert  and confused  Airway & Oxygen Therapy: Patient Spontanous Breathing and Patient connected to nasal cannula oxygen  Post-op Assessment: Report given to RN, Post -op Vital signs reviewed and stable and Patient moving all extremities  Post vital signs: Reviewed and stable  Last Vitals:  Vitals:   12/30/15 0947 12/30/15 1645  BP: (!) 143/77   Pulse: 72   Resp: 18   Temp: 37.2 C 36.5 C    Last Pain:  Vitals:   12/30/15 0947  TempSrc: Oral         Complications: No apparent anesthesia complications

## 2015-12-30 NOTE — Op Note (Signed)
Preoperative diagnosis: Left cerebellar metastases  Postoperative diagnosis: Left cerebellar metastases  Procedure: Stereotactic suboccipital craniectomy for removal of left cerebellar metastasis  Surgeon: Dominica Severin Sebastiana Wuest  Asst.: Newman Pies  Anesthesia: Gen.  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 Patient was recommended preoperative stereotactic radiosurgery and follow-up craniectomy for removal. Patient underwent radiosurgery yesterday and comes in today for surgical removal. I extensively reviewed the risks and benefits of the operation with the patient as well as perioperative course expectations of outcome and alternatives surgery and he understands and agrees to proceed forward.  Operative procedure: Patient brought into the or was induced on general anesthesia positioned prone with a shoulder bump under his left chest and turned slightly to the left and patient was locked in pins. The BrainLab stereotactic navigation system was then attached and registration was performed. The incision was localized over the tumor and a and after the head was shaved prepped and draped in routine sterile fashion I reconfirmed location of the skin incision and after infiltration of 10 mL lidocaine with epi and made a midline incision then carried the sub-periosteal dissection out exposing the left lateral cerebellar hemisphere. Identified the transverse sinus and sigmoid sinus with BrainLab and use that as my superior and lateral margins. I then drilled a craniectomy that would encompass both superior and inferior and medial to the tumor. Then opened up the dura in a cruciate fashion and the cerebellum was immediately identified I then performed a  corticectomy and was dissecting through a thin layer of peel membrane and identified immediately a milky substance coming out of the middle of the tumor. There was no definitive capsule although to develop a plane throughout most of the mass separating the tumor from normal cerebellum. Utilizing BrainLab at each step along the way to confirm superior medial lateral and inferior margins I resected the mass sent some some specimen for frozen and took out a large solitary 2 cm met. Sunday all for permanent section. Frozen pathology came back adenocarcinoma. I explored the bed of the resection site and all appeared to be normal cerebellum and edematous white matter. BrainLab confirmed all margins except for the loss of registration the deep margin but I did measure the solitary mass that I pulled out and it was approximately 2 cm in diameter consistent with the entirety of the solitary met as apparent on imaging and after further explored the entire bed and confirmed no gross tumor and confirmed with BrainLab I then maintained meticulous hemostasis was irrigation with Surgicel and the bed laid DuraGen over the dural opening and closed the wound in layers with interrupted Vicryl and a running nylon the skin. At the end of case all needle counts sponge counts were correct.

## 2015-12-30 NOTE — Anesthesia Procedure Notes (Signed)
Procedure Name: Intubation Date/Time: 12/30/2015 1:27 PM Performed by: Rebekah Chesterfield L Pre-anesthesia Checklist: Patient identified, Emergency Drugs available, Suction available and Patient being monitored Patient Re-evaluated:Patient Re-evaluated prior to inductionOxygen Delivery Method: Circle System Utilized Preoxygenation: Pre-oxygenation with 100% oxygen Intubation Type: IV induction Ventilation: Mask ventilation without difficulty Laryngoscope Size: Mac and 4 Grade View: Grade I Tube type: Oral Tube size: 7.5 mm Number of attempts: 1 Airway Equipment and Method: Stylet and Oral airway Placement Confirmation: ETT inserted through vocal cords under direct vision,  positive ETCO2 and breath sounds checked- equal and bilateral Secured at: 21 cm Tube secured with: Tape Dental Injury: Teeth and Oropharynx as per pre-operative assessment

## 2015-12-31 ENCOUNTER — Inpatient Hospital Stay (HOSPITAL_COMMUNITY): Payer: PPO

## 2015-12-31 LAB — GLUCOSE, CAPILLARY
Glucose-Capillary: 197 mg/dL — ABNORMAL HIGH (ref 65–99)
Glucose-Capillary: 159 mg/dL — ABNORMAL HIGH (ref 65–99)
Glucose-Capillary: 190 mg/dL — ABNORMAL HIGH (ref 65–99)
Glucose-Capillary: 192 mg/dL — ABNORMAL HIGH (ref 65–99)

## 2015-12-31 MED ORDER — ACETAMINOPHEN 325 MG PO TABS
650.0000 mg | ORAL_TABLET | ORAL | Status: DC | PRN
  Administered 2015-12-31: 650 mg via ORAL
  Filled 2015-12-31: qty 2

## 2015-12-31 MED ORDER — HYDROCODONE-ACETAMINOPHEN 5-325 MG PO TABS: 1.0000 | ORAL_TABLET | ORAL | Status: DC | PRN

## 2015-12-31 MED ORDER — GADOBENATE DIMEGLUMINE 529 MG/ML IV SOLN
15.0000 mL | Freq: Once | INTRAVENOUS | Status: AC
  Administered 2015-12-31: 15 mL via INTRAVENOUS

## 2015-12-31 NOTE — Progress Notes (Signed)
Pt arrived to unit. Oriented to room. Call light with in reach. rn witnessed pt walking. Will start signing pt off independent.

## 2015-12-31 NOTE — Care Management Note (Signed)
Case Management Note  Patient Details  Name: Christopher Clark MRN: [id number] Date of Birth: [birth year]  Subjective/Objective:  Pt admitted on 12/30/15 s/p craniotomy for LT cerebellar metastasis.  PTA, pt independent, lives with spouse.                   Action/Plan: Will follow for discharge planning as pt progresses.    Expected Discharge Date:                  Expected Discharge Plan:  Home/Self Care  In-House Referral:     Discharge planning Services  CM Consult  Post Acute Care Choice:    Choice offered to:     DME Arranged:    DME Agency:     HH Arranged:    HH Agency:     Status of Service:  In process, will continue to follow  If discussed at Long Length of Stay Meetings, dates discussed:    Additional Comments:  Reinaldo Raddle, RN, BSN  Trauma/Neuro ICU Case Manager [phone]

## 2015-12-31 NOTE — Progress Notes (Signed)
Wife had copy of living will with her, pt did not want a copy of living will placed in chart. rn did not push the issue.

## 2015-12-31 NOTE — Anesthesia Postprocedure Evaluation (Signed)
Anesthesia Post Note  Patient: Christopher Clark  Procedure(s) Performed: Procedure(s) (LRB): CRANIOTOMY SUBOCCIPITAL FOR LEFT CEREBELLAR METASTATIC (Left) APPLICATION OF CRANIAL NAVIGATION (N/A)  Patient location during evaluation: PACU Anesthesia Type: General Level of consciousness: awake and alert Pain management: pain level controlled Vital Signs Assessment: post-procedure vital signs reviewed and stable Respiratory status: spontaneous breathing, nonlabored ventilation, respiratory function stable and patient connected to nasal cannula oxygen Cardiovascular status: blood pressure returned to baseline and stable Postop Assessment: no signs of nausea or vomiting Anesthetic complications: no    Last Vitals:  Vitals:   12/31/15 1600 12/31/15 1723  BP: 117/84 124/62  Pulse: 65 64  Resp: 12 18  Temp: 37.3 C 36.5 C    Last Pain:  Vitals:   12/31/15 1723  TempSrc: Oral  PainSc:                  [name]

## 2015-12-31 NOTE — Progress Notes (Signed)
Patient ID: Christopher Clark, male   DOB: [birth year], 75 y.o.   MRN: [id number] Patient doing very well no significant headache no nausea no vomiting  Awake alert oriented strength out of 5 wound clean dry and intact  Mobilized this morning transferred to the floor after his MRI scan.

## 2016-01-01 ENCOUNTER — Encounter (HOSPITAL_COMMUNITY): Payer: Self-pay | Admitting: Neurosurgery

## 2016-01-01 LAB — GLUCOSE, CAPILLARY
GLUCOSE-CAPILLARY: 143 mg/dL — AB (ref 65–99)
Glucose-Capillary: 259 mg/dL — ABNORMAL HIGH (ref 65–99)

## 2016-01-01 MED ORDER — FAMOTIDINE 20 MG PO TABS: 20.0000 mg | ORAL_TABLET | Freq: Two times a day (BID) | ORAL | 0 refills | Status: AC

## 2016-01-01 MED ORDER — HYDROCODONE-ACETAMINOPHEN 5-325 MG PO TABS: 1.0000 | ORAL_TABLET | ORAL | 0 refills | Status: AC | PRN

## 2016-01-01 MED ORDER — FAMOTIDINE 20 MG PO TABS
20.0000 mg | ORAL_TABLET | Freq: Two times a day (BID) | ORAL | Status: DC
  Administered 2016-01-01: 20 mg via ORAL
  Filled 2016-01-01: qty 1

## 2016-01-01 NOTE — Discharge Instructions (Signed)
No lifting no bending no twisting  No driving keep the incision dry

## 2016-01-01 NOTE — Progress Notes (Signed)
Patient ID: Christopher Clark, male   DOB: 1940-04-18, 75 y.o.   MRN: [id number] Doing well no significant headache  Awake alert oriented strength 5  Out of 5 wound clean dry and intact  Discharge home

## 2016-01-01 NOTE — Care Management Note (Signed)
Case Management Note  Patient Details  Name: IVORY THORMAN MRN: [id number] Date of Birth: Nov 24, 1940  Subjective/Objective:                    Action/Plan: Pt discharging home with self care. No further needs per CM.   Expected Discharge Date:                  Expected Discharge Plan:  Home/Self Care  In-House Referral:     Discharge planning Services  CM Consult  Post Acute Care Choice:    Choice offered to:     DME Arranged:    DME Agency:     HH Arranged:    [place] Agency:     Status of Service:  Completed, signed off  If discussed at [employer] of Stay Meetings, dates discussed:    Additional Comments:  Pollie Friar, RN 01/01/2016, 10:19 AM

## 2016-01-01 NOTE — Progress Notes (Signed)
Pt being discharged from hospital per orders from MD. Pt and family educated on discharge instructions. Pt and family verbalized understanding of instructions. All questions and concerns were addressed. Pt's IV was removed before discharge. Pt refused insulin before discharge preferring to take care of administration at home. Pt exited the hospital via wheelchair.

## 2016-01-01 NOTE — Discharge Summary (Signed)
  Physician Discharge Summary  Patient ID: Christopher Clark MRN: [id number] DOB/AGE: [birth year] 75 y.o.  Admit date: 12/30/2015 Discharge date: 01/01/2016  Admission [place] cerebellar metastasis  Discharge Diagnoses: same Active Problems:   Brain metastases [facility])   Discharged Condition: good  Hospital Course: patient was admitted Hospital underwent suboccipital craniectomy for resection of left cerebellar metastasispostoperatively patient went to the ICU and then the floor and convalesced very well by postop day 2 was ambulating and voiding spontaneously and tolerati and stable for discharge  Consults: Significant Diagnostic Studies: Treatments:suboccipital cranfor resection cerebellar  metastasis Discharge Exam: Blood pressure (!) 122/57, pulse 65, temperature 98.3 F (36.8 C), temperature source Oral, resp. rate 20, height 5\' 11"  (1.803 m), weight 75.9 kg (167 lb 5 oz), SpO2 99 %.  intact  Disposition: home     Medication List    TAKE these medications   cholecalciferol 1000 units tablet Commonly known as:  VITAMIN D Take 1,000 Units by mouth daily.   dexamethasone 4 MG tablet Commonly known as:  DECADRON Take 4 mg by mouth 2 (two) times daily with a meal.   HYDROcodone-acetaminophen 5-325 MG tablet Commonly known as:  NORCO/VICODIN Take 1-2 tablets by mouth every 4 (four) hours as needed for moderate pain.   insulin glargine 100 UNIT/ML injection Commonly known as:  LANTUS Inject 35 Units into the skin at bedtime.   lisinopril 20 MG tablet Commonly known as:  PRINIVIL,ZESTRIL Take 10 mg by mouth every morning.   lovastatin 20 MG tablet Commonly known as:  MEVACOR Take 20 mg by mouth daily.   metFORMIN 500 MG tablet Commonly known as:  GLUCOPHAGE Take 500-1,000 mg by mouth 2 (two) times daily with a meal. Takes 1000 mg in the morning and 500 mg in the evening   VITAMIN B 12 PO Take 1 tablet by mouth daily.      Follow-up Information    Raeshawn Vo P, MD Follow up in 2 day(s).   Specialty:  Neurosurgery Contact information: [address] [place] [address] [phone]        Elaina Hoops, MD .   Specialty:  Neurosurgery Contact information: [address] [place] [address] [phone]           Signed: Elaina Hoops 01/01/2016, 8:05 AM

## 2016-01-03 NOTE — Progress Notes (Signed)
  Radiation Oncology         [phone] ________________________________  Name: Christopher Clark MRN: [id number]  Date: 12/29/2015  DOB: [birth year]  End of Treatment Note  Diagnosis:   75 y.o. 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  ExacTrac registration was performed for each couch angle.  The 81.3% isodose line was prescribed.  6 MV X-rays were delivered in the flattening filter free beam mode.  Narrative: The patient tolerated radiation treatment relatively well.   No complications occurred  Plan: The patient has completed pre-op radiation treatment. The patient will return to radiation oncology clinic for routine followup in one month. I advised him to call or return sooner if he has any questions or concerns related to his recovery or treatment. ________________________________  Sheral Apley. Tammi Klippel, M.D.

## 2016-01-04 DIAGNOSIS — C7931 Secondary malignant neoplasm of brain: Secondary | ICD-10-CM | POA: Diagnosis not present

## 2016-01-04 DIAGNOSIS — C250 Malignant neoplasm of head of pancreas: Secondary | ICD-10-CM | POA: Diagnosis not present

## 2016-01-05 ENCOUNTER — Encounter (HOSPITAL_COMMUNITY): Payer: Self-pay | Admitting: Emergency Medicine

## 2016-01-05 ENCOUNTER — Emergency Department (HOSPITAL_COMMUNITY)
Admission: EM | Admit: 2016-01-05 | Discharge: 2016-01-05 | Disposition: A | Discharge status: Home / Self Care | Payer: PPO | Attending: Emergency Medicine | Admitting: Emergency Medicine

## 2016-01-05 ENCOUNTER — Emergency Department (HOSPITAL_COMMUNITY): Payer: PPO

## 2016-01-05 DIAGNOSIS — Y939 Activity, unspecified: Secondary | ICD-10-CM | POA: Diagnosis not present

## 2016-01-05 DIAGNOSIS — Z86011 Personal history of benign neoplasm of the brain: Secondary | ICD-10-CM | POA: Diagnosis not present

## 2016-01-05 DIAGNOSIS — E1140 Type 2 diabetes mellitus with diabetic neuropathy, unspecified: Secondary | ICD-10-CM | POA: Diagnosis not present

## 2016-01-05 DIAGNOSIS — Y999 Unspecified external cause status: Secondary | ICD-10-CM | POA: Insufficient documentation

## 2016-01-05 DIAGNOSIS — Z794 Long term (current) use of insulin: Secondary | ICD-10-CM | POA: Diagnosis not present

## 2016-01-05 DIAGNOSIS — Z85828 Personal history of other malignant neoplasm of skin: Secondary | ICD-10-CM | POA: Insufficient documentation

## 2016-01-05 DIAGNOSIS — Z79899 Other long term (current) drug therapy: Secondary | ICD-10-CM | POA: Diagnosis not present

## 2016-01-05 DIAGNOSIS — S0993XA Unspecified injury of face, initial encounter: Secondary | ICD-10-CM | POA: Diagnosis not present

## 2016-01-05 DIAGNOSIS — W19XXXA Unspecified fall, initial encounter: Secondary | ICD-10-CM

## 2016-01-05 DIAGNOSIS — Y929 Unspecified place or not applicable: Secondary | ICD-10-CM | POA: Insufficient documentation

## 2016-01-05 DIAGNOSIS — I10 Essential (primary) hypertension: Secondary | ICD-10-CM | POA: Insufficient documentation

## 2016-01-05 DIAGNOSIS — Z7984 Long term (current) use of oral hypoglycemic drugs: Secondary | ICD-10-CM | POA: Diagnosis not present

## 2016-01-05 DIAGNOSIS — W0110XA Fall on same level from slipping, tripping and stumbling with subsequent striking against unspecified object, initial encounter: Secondary | ICD-10-CM | POA: Diagnosis not present

## 2016-01-05 DIAGNOSIS — S022XXA Fracture of nasal bones, initial encounter for closed fracture: Secondary | ICD-10-CM

## 2016-01-05 DIAGNOSIS — C7931 Secondary malignant neoplasm of brain: Secondary | ICD-10-CM | POA: Diagnosis not present

## 2016-01-05 DIAGNOSIS — Z8507 Personal history of malignant neoplasm of pancreas: Secondary | ICD-10-CM | POA: Diagnosis not present

## 2016-01-05 DIAGNOSIS — N289 Disorder of kidney and ureter, unspecified: Secondary | ICD-10-CM | POA: Diagnosis not present

## 2016-01-05 DIAGNOSIS — E860 Dehydration: Secondary | ICD-10-CM | POA: Diagnosis not present

## 2016-01-05 DIAGNOSIS — E119 Type 2 diabetes mellitus without complications: Secondary | ICD-10-CM | POA: Diagnosis not present

## 2016-01-05 DIAGNOSIS — Z510 Encounter for antineoplastic radiation therapy: Secondary | ICD-10-CM | POA: Diagnosis not present

## 2016-01-05 DIAGNOSIS — C259 Malignant neoplasm of pancreas, unspecified: Secondary | ICD-10-CM | POA: Diagnosis not present

## 2016-01-05 LAB — CBC WITH DIFFERENTIAL/PLATELET
Basophils Absolute: 0 K/uL (ref 0.0–0.1)
Basophils Relative: 0 %
Eosinophils Absolute: 0 K/uL (ref 0.0–0.7)
Eosinophils Relative: 0 %
HCT: 34.8 % — ABNORMAL LOW (ref 39.0–52.0)
Hemoglobin: 11.7 g/dL — ABNORMAL LOW (ref 13.0–17.0)
Lymphocytes Relative: 14 %
Lymphs Abs: 2.2 K/uL (ref 0.7–4.0)
MCH: 29.7 pg (ref 26.0–34.0)
MCHC: 33.6 g/dL (ref 30.0–36.0)
MCV: 88.3 fL (ref 78.0–100.0)
Monocytes Absolute: 1.4 K/uL — ABNORMAL HIGH (ref 0.1–1.0)
Monocytes Relative: 9 %
Neutro Abs: 12.6 K/uL — ABNORMAL HIGH (ref 1.7–7.7)
Neutrophils Relative %: 77 %
Platelets: 156 K/uL (ref 150–400)
RBC: 3.94 MIL/uL — ABNORMAL LOW (ref 4.22–5.81)
RDW: 14.7 % (ref 11.5–15.5)
WBC: 16.3 K/uL — ABNORMAL HIGH (ref 4.0–10.5)

## 2016-01-05 LAB — BASIC METABOLIC PANEL WITH GFR
Anion gap: 10 (ref 5–15)
BUN: 33 mg/dL — ABNORMAL HIGH (ref 6–20)
CO2: 22 mmol/L (ref 22–32)
Calcium: 8.8 mg/dL — ABNORMAL LOW (ref 8.9–10.3)
Chloride: 97 mmol/L — ABNORMAL LOW (ref 101–111)
Creatinine, Ser: 1.14 mg/dL (ref 0.61–1.24)
GFR calc Af Amer: >60 mL/min
GFR calc non Af Amer: >60 mL/min
Glucose, Bld: 106 mg/dL — ABNORMAL HIGH (ref 65–99)
Potassium: 4.3 mmol/L (ref 3.5–5.1)
Sodium: 129 mmol/L — ABNORMAL LOW (ref 135–145)

## 2016-01-05 LAB — URINALYSIS, ROUTINE W REFLEX MICROSCOPIC
Bilirubin Urine: NEGATIVE
Glucose, UA: NEGATIVE mg/dL
Hgb urine dipstick: NEGATIVE
Ketones, ur: 15 mg/dL — AB
Leukocytes, UA: NEGATIVE
Nitrite: NEGATIVE
Protein, ur: NEGATIVE mg/dL
Specific Gravity, Urine: 1.015 (ref 1.005–1.030)
pH: 5.5 (ref 5.0–8.0)

## 2016-01-05 MED ORDER — ACETAMINOPHEN 325 MG PO TABS
650.0000 mg | ORAL_TABLET | Freq: Once | ORAL | Status: AC
  Administered 2016-01-05: 650 mg via ORAL
  Filled 2016-01-05: qty 2

## 2016-01-05 NOTE — ED Notes (Signed)
IV removed.

## 2016-01-05 NOTE — ED Triage Notes (Signed)
Pt in from home via [facility] s/p fall. Pt was walking, tripped and fell forward, landing onto nose. No blood thinners, no LOC s/p fall. Hx of brain, pancreatic and liver CA. Recent cerebellar brain surgery on 9/20, incision to L lateral posterior head, sutures intact. Pt c/o HA. Pt alert, VSS.

## 2016-01-05 NOTE — ED Provider Notes (Signed)
[place] DEPT Provider Note   CSN: [id number] Arrival date & time: 01/05/16  1517     History   Chief Complaint Chief Complaint  Patient presents with  . Fall  . recent brain surgery    HPI TAVIAN CALLANDER is a 75 y.o. male with a Past medical history of pancreatic cancer with brain metastases, DM, HTN, HLD who presents to the ED today to be evaluated after a fall. Patient underwent suboccipital craniectomy for resection of left cerebellar metastasis on 9/22 performed by Dr. Saintclair Halsted. Patient states he has been doing well since surgery until today when he was ambulating from the bathroom to the kitchen patient became dizzy upon standing, stumbled over his feet and fell forward landing on his face. Pt states that he often feels dizzy upon standing and this is not new for him. Patient was able to ambulate since the accident. He denies any loss of consciousness. Patient is not anticoagulated. He states he knocked his right front tooth out and cut his nose. Pt denies and fevers, chills, chest pain, SOB, weakness, dizziness, LOC.     HPI  Past Medical History:  Diagnosis Date  . Brain tumor (benign) ([place])   . Cancer ([place])    basal cell carcinoma.Pancreas cancer  . Cataracts, bilateral   . Diabetes mellitus without complication ([place])    takes Lantus nightly and Metformin daily.Average fasting blood sugar 120-180  . History of blood transfusion    no abnormal reaction noted  . Hypertension    takes Lisinopril daily  . Hypocholesteremia    takes Lovastatin daily  . Peripheral neuropathy [facility])     Patient Active Problem List   Diagnosis Date Noted  . Brain metastases ([place]) 12/30/2015  . Brain metastasis ([place]) 12/25/2015  . Pancreatic cancer ([place]) 12/25/2015    Past Surgical History:  Procedure Laterality Date  . APPLICATION OF CRANIAL NAVIGATION N/A 12/30/2015   Procedure: APPLICATION OF CRANIAL NAVIGATION;  Surgeon: Kary Kos, MD;  Location: [place] NEURO ORS;  Service:  Neurosurgery;  Laterality: N/A;  . CERVICAL FUSION    . CHOLECYSTECTOMY    . COLONOSCOPY    . CRANIOTOMY Left 12/30/2015   Procedure: CRANIOTOMY SUBOCCIPITAL FOR LEFT CEREBELLAR METASTATIC;  Surgeon: Kary Kos, MD;  Location: [place] NEURO ORS;  Service: Neurosurgery;  Laterality: Left;  CRANIOTOMY SUBOCCIPITAL FOR LEFT CEREBELLA MET  . EUS N/A 09/11/2013   Procedure: ESOPHAGEAL ENDOSCOPIC ULTRASOUND (EUS) RADIAL;  Surgeon: Arta Silence, MD;  Location: [facility] END[facility]COPY;  Service: Endoscopy;  Laterality: N/A;  . FINE NEEDLE ASPIRATION N/A 09/11/2013   Procedure: FINE NEEDLE ASPIRATION (FNA) RADIAL;  Surgeon: Arta Silence, MD;  Location: [facility] ENDOSCOPY;  Service: Endoscopy;  Laterality: N/A;       Home Medications    Prior to Admission medications   Medication Sig Start Date End Date Taking? Authorizing Provider  cholecalciferol (VITAMIN D) 1000 UNITS tablet Take 1,000 Units by mouth daily.    Historical Provider, MD  Cyanocobalamin (VITAMIN B 12 PO) Take 1 tablet by mouth daily.    Historical Provider, MD  dexamethasone (DECADRON) 4 MG tablet Take 4 mg by mouth 2 (two) times daily with a meal.    Historical Provider, MD  famotidine (PEPCID) 20 MG tablet Take 1 tablet (20 mg total) by mouth 2 (two) times daily. 01/01/16   Kary Kos, MD  HYDROcodone-acetaminophen (NORCO/VICODIN) 5-325 MG tablet Take 1-2 tablets by mouth every 4 (four) hours as needed for moderate pain. 01/01/16   Kary Kos, MD  insulin glargine (LANTUS) 100 UNIT/ML injection Inject 35 Units into the skin at bedtime.    Historical Provider, MD  lisinopril (PRINIVIL,ZESTRIL) 20 MG tablet Take 10 mg by mouth every morning.     Historical Provider, MD  lovastatin (MEVACOR) 20 MG tablet Take 20 mg by mouth daily.    Historical Provider, MD  metFORMIN (GLUCOPHAGE) 500 MG tablet Take 500-1,000 mg by mouth 2 (two) times daily with a meal. Takes 1000 mg in the morning and 500 mg in the evening    Historical Provider, MD    Family History No  family history on file.  Social History Social History  Substance Use Topics  . Smoking status: Never Smoker  . Smokeless tobacco: Never Used  . Alcohol use No     Allergies   No known allergies   Review of Systems Review of Systems  All other systems reviewed and are negative.    Physical Exam Updated Vital Signs BP 132/67 (BP Location: Right Arm)   Pulse 60   Resp 16   Ht '5\' 10"'  (1.778 m)   Wt 75.8 kg   SpO2 100%   BMI 23.96 kg/m   Physical Exam  Constitutional: He is oriented to person, place, and time. He appears well-developed and well-nourished. No distress.  HENT:  Head: Normocephalic.  Mouth/Throat: No oropharyngeal exudate.  No battle sign. No raccoon eyes. No hemotympanum. Healing surgical scar with sutures in place over left occipital region. No bleeding or sign of infection.  Small superficial abrasion over bridge of nose without obvious bony deformity.  Complete fracture of right central incisor. Root intact. No maxillary or mandibular tenderness.  Eyes: Conjunctivae and EOM are normal. Pupils are equal, round, and reactive to light. Right eye exhibits no discharge. Left eye exhibits no discharge. No scleral icterus.  Neck: Normal range of motion. Neck supple.  Cardiovascular: Normal rate, regular rhythm, normal heart sounds and intact distal pulses.  Exam reveals no gallop and no friction rub.   No murmur heard. Pulmonary/Chest: Effort normal and breath sounds normal. No respiratory distress. He has no wheezes. He has no rales. He exhibits no tenderness.  Abdominal: Soft. Bowel sounds are normal. He exhibits no distension. There is no tenderness. There is no guarding.  Musculoskeletal: Normal range of motion. He exhibits no edema, tenderness or deformity.  No midline spinal TTP. FROM of C, T, L spine. No step offs or obvious bony deformities. Negative SLR.    Neurological: He is alert and oriented to person, place, and time. No cranial nerve deficit.    Strength 5/5 throughout. No sensory deficits. No gait abnormality. No dysmetria. No slurred speech. No facial droop. Negative pronator drift.    Skin: Skin is warm and dry. No rash noted. He is not diaphoretic. No erythema. No pallor.  Psychiatric: He has a normal mood and affect. His behavior is normal.  Nursing note and vitals reviewed.    ED Treatments / Results  Labs (all labs ordered are listed, but only abnormal results are displayed) Labs Reviewed  BASIC METABOLIC PANEL  CBC WITH DIFFERENTIAL/PLATELET  URINALYSIS, ROUTINE W REFLEX MICROSCOPIC (NOT AT [facility])    EKG  EKG Interpretation None       Radiology Ct Head Wo Contrast  Result Date: 01/05/2016 CLINICAL DATA:  75 y/o M; status post fall landing on nose. Recent cerebellar brain surgery 12/30/2015. Complaint of headache. EXAM: CT HEAD WITHOUT CONTRAST CT MAXILLOFACIAL WITHOUT CONTRAST TECHNIQUE: Contiguous axial images were obtained from the base  of the skull through the vertex without intravenous contrast. Contiguous axial images of the facial bones was obtained without intravenous contrast. COMPARISON:  12/31/2015 MR head.  12/18/2015 CT head. FINDINGS: Brain: Surgical cavity within the left cerebellar hemisphere with surrounding hypoattenuation corresponding to FLAIR signal abnormality on prior MR probably representing postsurgical changes and edema. No evidence for new large territory infarct, focal mass effect, or intracranial hemorrhage. Background of mild to moderate chronic microvascular ischemic changes and parenchymal volume loss. Intraventricular hemorrhage seen on MRI is not clearly appreciated on CT. Stable ventricle size. Partial effacement of fourth ventricle. Vascular: No hyperdense vessel. Calcific atherosclerosis of cavernous internal carotid arteries. Skull: Left suboccipital craniectomy with apparent fluid in the postoperative bed. Sinuses/Orbits: See below. Other: None. CT MAXILLOFACIAL FINDINGS Osseous:  Mildly impacted and buckled nasal bone fractures with slight leftward angulation. No other fracture is identified. Orbits: Negative. No traumatic or inflammatory finding. Sinuses: Clear. Soft tissues: No large hematoma. Limited intracranial:  As above. IMPRESSION: 1. No acute intracranial abnormality. 2. Postsurgical changes of left cerebellar hemisphere post suboccipital craniectomy grossly stable in comparison with prior MRI given differences in technique. 3. Mildly impacted and buckled nasal bone fractures with slight leftward angulation. No other fracture identified. Electronically Signed   By: Kristine Garbe M.D.   On: 01/05/2016 16:53   Ct Cervical Spine Wo Contrast  Result Date: 01/05/2016 CLINICAL DATA:  Tripped and fell forward. Pain on the back of the head. EXAM: CT CERVICAL SPINE WITHOUT CONTRAST TECHNIQUE: Multidetector CT imaging of the cervical spine was performed without intravenous contrast. Multiplanar CT image reconstructions were also generated. COMPARISON:  None. FINDINGS: Alignment: Normal. Skull base and vertebrae: No acute fracture. No primary bone lesion or focal pathologic process. Left suboccipital craniectomy. Soft tissues and spinal canal: No prevertebral fluid or swelling. No visible canal hematoma. Disc levels: Anterior cervical disc fusion from C4 through C6 without failure or complication. Solid osseous bridging across the C4-5 and C5-6 disc spaces. Mild left facet arthropathy at C2-3. Bilateral facet arthropathy and uncovertebral degenerative changes at C3-4 with foraminal narrowing. Broad-based disc osteophyte complex at C3-4. Osseous ridging along the left paracentral aspect of C5-6. Bilateral uncovertebral degenerative changes at C5-6 with foraminal narrowing. Upper chest: Lung apices are clear. Other: No fluid collection or hematoma. IMPRESSION: 1.  No acute osseous injury of the cervical spine. Electronically Signed   By: Kathreen Devoid   On: 01/05/2016 17:39   Ct  Maxillofacial Wo Contrast  Result Date: 01/05/2016 CLINICAL DATA:  75 y/o M; status post fall landing on nose. Recent cerebellar brain surgery 12/30/2015. Complaint of headache. EXAM: CT HEAD WITHOUT CONTRAST CT MAXILLOFACIAL WITHOUT CONTRAST TECHNIQUE: Contiguous axial images were obtained from the base of the skull through the vertex without intravenous contrast. Contiguous axial images of the facial bones was obtained without intravenous contrast. COMPARISON:  12/31/2015 MR head.  12/18/2015 CT head. FINDINGS: Brain: Surgical cavity within the left cerebellar hemisphere with surrounding hypoattenuation corresponding to FLAIR signal abnormality on prior MR probably representing postsurgical changes and edema. No evidence for new large territory infarct, focal mass effect, or intracranial hemorrhage. Background of mild to moderate chronic microvascular ischemic changes and parenchymal volume loss. Intraventricular hemorrhage seen on MRI is not clearly appreciated on CT. Stable ventricle size. Partial effacement of fourth ventricle. Vascular: No hyperdense vessel. Calcific atherosclerosis of cavernous internal carotid arteries. Skull: Left suboccipital craniectomy with apparent fluid in the postoperative bed. Sinuses/Orbits: See below. Other: None. CT MAXILLOFACIAL FINDINGS Osseous: Mildly impacted and  buckled nasal bone fractures with slight leftward angulation. No other fracture is identified. Orbits: Negative. No traumatic or inflammatory finding. Sinuses: Clear. Soft tissues: No large hematoma. Limited intracranial:  As above. IMPRESSION: 1. No acute intracranial abnormality. 2. Postsurgical changes of left cerebellar hemisphere post suboccipital craniectomy grossly stable in comparison with prior MRI given differences in technique. 3. Mildly impacted and buckled nasal bone fractures with slight leftward angulation. No other fracture identified. Electronically Signed   By: Kristine Garbe M.D.   On:  01/05/2016 16:53    Procedures Procedures (including critical care time)  Medications Ordered in ED Medications - No data to display   Initial Impression / Assessment and Plan / ED Course  I have reviewed the triage vital signs and the nursing notes.  Pertinent labs & imaging results that were available during my care of the patient were reviewed by me and considered in my medical decision making (see chart for details).  Clinical Course   75 year old male with history of metastatic pancreatic cancer, recent craniectomy on 922 presents today to be evaluated after a fall. Patient states he stood up too quickly and stumbled over his feet totaling 14 and landed on his face. On presentation to the ED patient appears well, in no apparent distress. No neurological deficits noted on exam. Surgical wound over left occiput appears clean dry and intact. No bleeding or sign of skull depression. CT maxillofacial reveals mildly impacted and buckled nasal fractures, normal postsurgical changes. CT cervical spine unremarkable. Patient is ambulatory in the ED without difficulty. Sodium is 129, this appears to patient's baseline. All other lab work unremarkable. Patient has follow-up with Dr. Saintclair Halsted with neurosurgery in 2 days. He will follow up with ENT in a dentist as an outpatient. Patient is currently asymptomatic and requesting discharge. Return precautions outlined in patient discharge instructions.  Patient was discussed with and seen by Dr. Lita Mains who agrees with the treatment plan.    Final Clinical Impressions(s) / ED Diagnoses   Final diagnoses:  Fall, initial encounter  Nasal bone fracture, closed, initial encounter    New Prescriptions New Prescriptions   No medications on file     Carlos Levering, PA-C 01/05/16 1901    Julianne Rice, MD 01/06/16 0020

## 2016-01-05 NOTE — Discharge Instructions (Signed)
Please follow up with an ENT physician for your nasal bone fractures. Follow up with a dentist for your tooth fracture. Keep scheduled appointment with Dr. Saintclair Halsted on Thursday. Return to the ED if you experience repeat fall, increased dizziness, loss of consciousness, weakness, numbness or tingling in an extremity.

## 2016-01-05 NOTE — ED Notes (Signed)
Pt and wife state they understand instructions. Pt home stable with wife.

## 2016-01-15 ENCOUNTER — Other Ambulatory Visit: Payer: Self-pay

## 2016-01-15 NOTE — Patient Outreach (Signed)
[name] [facility]) Care Management  01/15/2016  Christopher Clark 1940-08-11 [id number]  REFERRAL DATE; 01/08/16  REFERRAL SOURCE: [facility]/ HTA REFERRAL REASON: Ongoing education and disease management.  Status post inpatient discharge 01/01/16  Telephone call to patient regarding silverback referral. Unable to reach patient. HIPAA compliant voice message left with call back phone number.   PLAN:  RNCM will attempt 2nd telephone outreach to patient within  1 week.    [name] RN,BSN,CCM [facility] Telephonic  [phone]

## 2016-01-18 ENCOUNTER — Other Ambulatory Visit: Payer: Self-pay

## 2016-01-18 NOTE — Patient Outreach (Signed)
[place] [facility]) Care Management  01/18/2016  Christopher Clark [birth year] [id number]   REFERRAL DATE: 01/08/16  REFERRAL SOURCE: [facility] / hta REFERRAL REASON: Ongoing education and disease management CONSENT: self/ patient  PROVIDER:  Dr. Greig Right - primary MD  Dr. Kary Kos -  Neurosurgeon  SOCIAL Patient lives with spouse.   Patient reports independent in ADL'S Assistant/ dependent in IADL'S Transportation provided by patients spouse  SUBJECTIVE: Telephone call to patient regarding silverback referral. HIPAA verified with patient. Discussed and offered [facility] care management services to patient. Patient verbally agreed to services.    STATUS POST SURGERY:  Patient status post surgery 12/30/15 - 01/01/16 for craniectomy due to cerebullar metastasis.  Patient states he was told by the doctor that all the cancer in his head was removed.  Patient reports he is also status post stage liver and pancreatic cancer. Patient states this in remission.   FALL:  Patient states since his surgery he has been very weak.  Patient states within the past week his feet have swollen up which has made it difficulty for him to walk.  Patient states he has recently fallen at home and fractured his nose, broke his front tooth, and hurt his leg. Patient states he does not have home health.  Patient states there was discuss about him having physical therapy but that has not been ordered.   PRIMARY MD FOLLOW UP: Patient states he went for a follow up visit today with his primary MD today. Patient states due to computer issues at the office he was not able to be seen by his primary.  Patient states he is awaiting a call from his primary MD office to reschedule his appointment.  PSYCHOSOCIAL:  Patient states she is not sleeping well.  States he only sleeps 4 hours per night. Patient states he does not feel like himself and is unable to do the things he use to. Patient refused [facility] social work services  at this time.   DIABETES: Patient states he has diabetes. Patient reports he is on metformin and insulin. Patient states his blood sugars have been running in the 200's since his surgery. Patient reports today's fasting blood sugar was 242.  Patient states he has a chart that he records his blood sugars on.   ASSESSMENT: Patient status post hospitalization 12/30/15- 01/01/16.  Will need transition of care follow up by [facility].   PLAN:  RNCM will refer patient to community case manager.   [name] RN,BSN,CCM [facility] Telephonic  [phone]

## 2016-01-19 ENCOUNTER — Other Ambulatory Visit: Payer: Self-pay | Admitting: *Deleted

## 2016-01-19 DIAGNOSIS — I499 Cardiac arrhythmia, unspecified: Secondary | ICD-10-CM | POA: Diagnosis not present

## 2016-01-19 DIAGNOSIS — Z23 Encounter for immunization: Secondary | ICD-10-CM | POA: Diagnosis not present

## 2016-01-19 DIAGNOSIS — C250 Malignant neoplasm of head of pancreas: Secondary | ICD-10-CM | POA: Diagnosis not present

## 2016-01-19 DIAGNOSIS — E1140 Type 2 diabetes mellitus with diabetic neuropathy, unspecified: Secondary | ICD-10-CM | POA: Diagnosis not present

## 2016-01-19 DIAGNOSIS — R609 Edema, unspecified: Secondary | ICD-10-CM | POA: Diagnosis not present

## 2016-01-19 DIAGNOSIS — I10 Essential (primary) hypertension: Secondary | ICD-10-CM | POA: Diagnosis not present

## 2016-01-19 NOTE — Patient Outreach (Addendum)
[facility]) Care Management  01/19/2016  ANGELES KOELLNER 11/30/1940 [id number]   Referral from telephonic health coach,  For transition of care Patient discharged from Hospital on 01/01/16 S/P craniectomy for left cerebellar metastasis   Initial call for transition of care placed to [name], no answer able to leave a HIPAA compliant requesting a call back.  Plan Will await return call if no response will attempt call in the next day.   Joylene Draft, RN, [place] Management [phone]- Mobile [phone]- Toll Free Main Office

## 2016-01-20 ENCOUNTER — Other Ambulatory Visit: Payer: Self-pay | Admitting: *Deleted

## 2016-01-20 ENCOUNTER — Encounter: Payer: Self-pay | Admitting: *Deleted

## 2016-01-20 NOTE — Patient Outreach (Addendum)
[facility]) Care Management  01/20/2016  Christopher Clark August 08, 1940 [id number]  Referral for Transition of care - Status post craniectomy ,  Transition of care call  Placed call to patient, HiPAA information verified.  Primary follow up   Patient reports that he is doing fairly well on today, reports that he had office visit with Dr.Penner  on yesterday and states lab work done and result were good.  Patient discussed recent problem with swelling in his lower legs, and was prescribed lasix 40 mg daily for 3 days.  Diabetes Patient discussed that his blood sugar this morning was 122 and last night reading was 300,  He takes Lantus 45 units at bedtime. Patient was recently discharged from hospital and all medications have been reviewed.  Fall Christopher Clark discussed having a fall since discharge , fractured his nose and broke a tooth , reporting he has been more careful since then getting some stronger.  Recent Surgery  Patient discussed his recent craniectomy,states the doctor states he got it all, and history with pancreas,  liver cancer an the treatment he received . Patient has follow up appointment with Dr.Lewis, Oncologist on next week  Christopher Clark denied any urgent concerns at this time.   Plan Patient agreeable to transition of care, and weekly outreaches, will plan telephone outreach on next week ,and home visit scheduled for within 2 weeks    [facility] CM Care Plan Problem One   Flowsheet Row Most Recent Value  Care Plan Problem One  Recent hospitalization related to surgery   Role Documenting the Problem One  Care Management [place] for Problem One  Active  THN Long Term Goal (31-90 days)  Patient will not experience hospital admission in the next 31 days   THN Long Term Goal Start Date  01/20/16  Interventions for Problem One Long Term Goal  Reviewed transition of care program, weekly outreaches,provided contact information  reinforced taking  medications as prescribed,   THN CM Short Term Goal #1 (0-30 days)  Patient will continue to check blood sugar daily and record in the next 30 days   THN CM Short Term Goal #1 Start Date  01/20/16  Interventions for Short Term Goal #1  Encouraged patient to keep a record of blood sugar readings and notify MD of consistent elevations greater than 200's   THN CM Short Term Goal #2 (0-30 days)  Patient will be able to state at least 2 fall prevention measures in the next 30 days   THN CM Short Term Goal #2 Start Date  01/20/16  Interventions for Short Term Goal #2  Discussed importance of changing positions slowly, stand at least 10 seconds before starting to walk after rising from sitting to standing      Joylene Draft, RN, [place] Management [phone]- Mobile [phone]- [place]  .

## 2016-01-25 DIAGNOSIS — E119 Type 2 diabetes mellitus without complications: Secondary | ICD-10-CM

## 2016-01-25 DIAGNOSIS — E871 Hypo-osmolality and hyponatremia: Secondary | ICD-10-CM | POA: Diagnosis not present

## 2016-01-25 DIAGNOSIS — I10 Essential (primary) hypertension: Secondary | ICD-10-CM

## 2016-01-25 DIAGNOSIS — I4891 Unspecified atrial fibrillation: Secondary | ICD-10-CM | POA: Diagnosis not present

## 2016-01-25 DIAGNOSIS — E785 Hyperlipidemia, unspecified: Secondary | ICD-10-CM | POA: Diagnosis not present

## 2016-01-25 DIAGNOSIS — C259 Malignant neoplasm of pancreas, unspecified: Secondary | ICD-10-CM | POA: Diagnosis not present

## 2016-01-26 DIAGNOSIS — Z8249 Family history of ischemic heart disease and other diseases of the circulatory system: Secondary | ICD-10-CM | POA: Diagnosis not present

## 2016-01-26 DIAGNOSIS — I4891 Unspecified atrial fibrillation: Secondary | ICD-10-CM | POA: Diagnosis not present

## 2016-01-26 DIAGNOSIS — C259 Malignant neoplasm of pancreas, unspecified: Secondary | ICD-10-CM | POA: Diagnosis not present

## 2016-01-26 DIAGNOSIS — E785 Hyperlipidemia, unspecified: Secondary | ICD-10-CM | POA: Diagnosis not present

## 2016-01-26 DIAGNOSIS — E871 Hypo-osmolality and hyponatremia: Secondary | ICD-10-CM | POA: Diagnosis not present

## 2016-01-26 DIAGNOSIS — R079 Chest pain, unspecified: Secondary | ICD-10-CM | POA: Diagnosis not present

## 2016-01-27 ENCOUNTER — Other Ambulatory Visit: Payer: Self-pay | Admitting: *Deleted

## 2016-01-27 DIAGNOSIS — I10 Essential (primary) hypertension: Secondary | ICD-10-CM

## 2016-01-27 DIAGNOSIS — E119 Type 2 diabetes mellitus without complications: Secondary | ICD-10-CM

## 2016-01-27 DIAGNOSIS — I4891 Unspecified atrial fibrillation: Secondary | ICD-10-CM | POA: Diagnosis not present

## 2016-01-27 DIAGNOSIS — C259 Malignant neoplasm of pancreas, unspecified: Secondary | ICD-10-CM | POA: Diagnosis not present

## 2016-01-27 DIAGNOSIS — E785 Hyperlipidemia, unspecified: Secondary | ICD-10-CM | POA: Diagnosis not present

## 2016-01-27 DIAGNOSIS — E871 Hypo-osmolality and hyponatremia: Secondary | ICD-10-CM | POA: Diagnosis not present

## 2016-01-27 DIAGNOSIS — R0789 Other chest pain: Secondary | ICD-10-CM | POA: Diagnosis not present

## 2016-01-27 NOTE — Patient Outreach (Signed)
[facility]) Care Management  01/27/2016  Christopher Clark 12/08/1940 [id number]   Placed scheduled transition of care call to Patient, he states that he was just getting discharged from [facility] due to chest pain and his heart rate being fast.  [name] requested that I call him back in a couple of days.  Plan Will place transition on care call in the next 2 days.  Joylene Draft, RN, [place] Management [phone]- Mobile [phone]- Toll Free Main Office

## 2016-01-29 ENCOUNTER — Other Ambulatory Visit: Payer: Self-pay | Admitting: *Deleted

## 2016-01-29 DIAGNOSIS — C7931 Secondary malignant neoplasm of brain: Secondary | ICD-10-CM

## 2016-01-29 DIAGNOSIS — C250 Malignant neoplasm of head of pancreas: Secondary | ICD-10-CM

## 2016-01-29 DIAGNOSIS — I4891 Unspecified atrial fibrillation: Secondary | ICD-10-CM

## 2016-01-29 NOTE — Patient Outreach (Signed)
[facility]) Care Management  01/29/2016  Christopher Clark Jan 28, 1941 [id number]   Transition of care  Note  Placed call to patient as part of transition of care program. Christopher Clark able verify HIPAA information . Patient reports that he is doing well, discussed recent hospital readmission for rapid heart rate, Atrial fibrillation  and chest pain . Patient reports that rate is controlled now denies chest pain and denies feeling as if heart rate is fast.  He is taking medications as prescribed. Patient was recently discharged from hospital and all medications have been reviewed. Patient has arranged post hospital visit with Dr.Penner on next week, wife will provide transportation .   [name] discussed our original scheduled home visit for 10/23 and states that he wants to delay home visit until November, because he has a lot going on. He is agreeable to receiving weekly telephone outreach calls for now.  Patient denies any new concerns at this time , he continues to check his blood sugar at least twice daily with still concern with reading 200's, will discuss with Dr.Penner at next visit.Encouraged patient with drinking adequate fluids and eating balanced diet.  Plan Will continue with weekly transition of care outreaches.  THN CM Care Plan Problem One   Flowsheet Row Most Recent Value  Care Plan Problem One  Recent hospitalization high risk for readmission  Role Documenting the Problem One  Care Management [place] for Problem One  Active  THN Long Term Goal (31-90 days)  Patient will not experience hospital admission in the next 31 days   THN Long Term Goal Start Date  01/29/16 [name] date restarted patient experienced readmission]  Interventions for Problem One Long Term Goal  Reinforced following discharge instructions, reviewed,, notify MD of chest pain or increased heart rate. taking medications as prescribed and notifying MD of new or worsening symptoms    THN CM Short Term Goal #1 (0-30 days)  Patient will continue to check blood sugar daily and record in the next 30 days   THN CM Short Term Goal #1 Start Date  01/29/16  Interventions for Short Term Goal #1  Encouraged patient to keep a record of blood sugar readings and notify MD of consistent elevations greater than 200's   THN CM Short Term Goal #2 (0-30 days)  Patient will be able to state at least 2 fall prevention measures in the next 30 days   THN CM Short Term Goal #2 Start Date  01/29/16 [name] date restarted due to admission]  Interventions for Short Term Goal #2  Discussed importance of changing positions slowly, stand at least 10 seconds before starting to walk after rising from sitting to standing   THN CM Short Term Goal #3 (0-30 days)  Patient will attend PCP visit within 5 days of discharge   [facility] CM Short Term Goal #3 Start Date  01/29/16  Interventions for Short Tern Goal #3  Reviewed with patient importance of attending all MD appointments, take medications for review.      Joylene Draft, RN, [place] Management [phone]- Mobile [phone]- Toll Free Main Office

## 2016-02-01 ENCOUNTER — Ambulatory Visit: Payer: Self-pay | Admitting: *Deleted

## 2016-02-05 ENCOUNTER — Other Ambulatory Visit: Payer: Self-pay | Admitting: *Deleted

## 2016-02-05 NOTE — Patient Outreach (Signed)
[facility]) Care Management  02/05/2016  Christopher Clark 06-Apr-1941 [id number]  Transition of care call  Placed call to patient as part of transition of care program, no answer able to leave a HIPAA compliant message requesting a return call.  Plan Will await return call if no response will attempt to contact patient in the next week as part of transition of care outreach.   Joylene Draft, RN, [place] Management [phone]- Mobile [phone]- Toll Free Main Office

## 2016-02-08 ENCOUNTER — Encounter: Payer: Self-pay | Admitting: Radiation Oncology

## 2016-02-08 ENCOUNTER — Ambulatory Visit: Admission: RE | Admit: 2016-02-08 | Payer: PPO | Source: Ambulatory Visit

## 2016-02-08 ENCOUNTER — Ambulatory Visit
Admission: RE | Admit: 2016-02-08 | Discharge: 2016-02-08 | Disposition: A | Discharge status: Home / Self Care | Payer: PPO | Source: Ambulatory Visit | Attending: Radiation Oncology | Admitting: Radiation Oncology

## 2016-02-08 DIAGNOSIS — Z794 Long term (current) use of insulin: Secondary | ICD-10-CM | POA: Diagnosis not present

## 2016-02-08 DIAGNOSIS — C7931 Secondary malignant neoplasm of brain: Secondary | ICD-10-CM

## 2016-02-08 DIAGNOSIS — Z8507 Personal history of malignant neoplasm of pancreas: Secondary | ICD-10-CM | POA: Diagnosis not present

## 2016-02-08 DIAGNOSIS — Z79899 Other long term (current) drug therapy: Secondary | ICD-10-CM | POA: Insufficient documentation

## 2016-02-08 DIAGNOSIS — Z923 Personal history of irradiation: Secondary | ICD-10-CM | POA: Insufficient documentation

## 2016-02-08 DIAGNOSIS — Z7982 Long term (current) use of aspirin: Secondary | ICD-10-CM | POA: Insufficient documentation

## 2016-02-08 NOTE — Progress Notes (Signed)
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    ICD-9-CM ICD-10-CM   1. Brain metastases (HCC) 198.3 C79.31     Interval Since Last Radiation:  1 month 12/29/15 SRS Treatment  Narrative:  The patient returns today for routine follow-up.  Weight and vitals are stable. Patient denies pain, nausea, or headaches. He is alert and oriented x 3. No change in motor skills have been noted.                              ALLERGIES:  is allergic to no known allergies.  Meds: Current Outpatient Prescriptions  Medication Sig Dispense Refill  . aspirin 325 MG tablet Take 325 mg by mouth daily.    . cholecalciferol (VITAMIN D) 1000 UNITS tablet Take 1,000 Units by mouth daily.    . Cyanocobalamin (VITAMIN B 12 PO) Take 1 tablet by mouth daily.    Marland Kitchen diltiazem (CARDIZEM CD) 120 MG 24 hr capsule Take 120 mg by mouth daily.    Marland Kitchen diltiazem (CARDIZEM SR) 120 MG 12 hr capsule Take 120 mg by mouth 2 (two) times daily.    . famotidine (PEPCID) 20 MG tablet Take 1 tablet (20 mg total) by mouth 2 (two) times daily. 60 tablet 0  . insulin glargine (LANTUS) 100 UNIT/ML injection Inject 45 Units into the skin at bedtime.     Marland Kitchen lisinopril (PRINIVIL,ZESTRIL) 20 MG tablet Take 10 mg by mouth every morning.     . lovastatin (MEVACOR) 20 MG tablet Take 20 mg by mouth daily.    . metFORMIN (GLUCOPHAGE) 500 MG tablet Take 500-1,000 mg by mouth 2 (two) times daily with a meal. Takes 1000 mg in the morning and 500 mg in the evening    . HYDROcodone-acetaminophen (NORCO/VICODIN) 5-325 MG tablet Take 1-2 tablets by mouth every 4 (four) hours as needed for moderate pain. (Patient not taking: Reported on 02/08/2016) 30 tablet 0   No  current facility-administered medications for this encounter.     Physical Findings: The patient is in no acute distress. Patient is alert and oriented.  height is 5\' 10"  (1.778 m) and weight is 168 lb 12.8 oz (76.6 kg). His oral temperature is 98 F (36.7 C). His blood pressure is 142/73 (abnormal) and his pulse is 66. His respiration is 18 and oxygen saturation is 100%. .  No significant changes. Suboccipital craniectomy incision is well healed. Cerebellar function intact.  Lab Findings: Lab Results  Component Value Date   WBC 16.3 (H) 01/05/2016   HGB 11.7 (L) 01/05/2016   HCT 34.8 (L) 01/05/2016   PLT 156 01/05/2016    Lab Results  Component Value Date   NA 129 (L) 01/05/2016   NA 130 (L) 12/25/2015   K 4.3 01/05/2016   K 5.2 (H) 12/25/2015   CHLORIDE 95 (L) 12/25/2015   CO2 22 01/05/2016   CO2 19 (L) 12/25/2015   GLUCOSE 106 (H) 01/05/2016   GLUCOSE 336 (H) 12/25/2015   BUN 33 (H) 01/05/2016   BUN 49.5 (H) 12/25/2015   CREATININE 1.14 01/05/2016   CREATININE 1.8 (H) 12/25/2015   BILITOT 0.86 12/25/2015   ALKPHOS  63 12/25/2015   AST 15 12/25/2015   ALT 31 12/25/2015   PROT 7.3 12/25/2015   ALBUMIN 3.8 12/25/2015   CALCIUM 8.8 (L) 01/05/2016   CALCIUM 9.6 12/25/2015   ANIONGAP 10 01/05/2016    Radiographic Findings: No results found.  Impression:  The patient is recovering from the effects of radiation.   Plan: Follow up brain MRI in 2 months then see Dr. Saintclair Halsted. _____________________________________  Sheral Apley. Tammi Klippel, M.D.  This document serves as a record of services personally performed by Tyler Pita, MD. It was created on his behalf by Bethann Humble, a trained medical scribe. The creation of this record is based on the scribe's personal observations and the provider's statements to them. This document has been checked and approved by the attending provider.

## 2016-02-08 NOTE — Progress Notes (Signed)
Weight and vital signs are stable. [name] here for one month follow after [facility] treatment.   He denies pain. He denies nausea or a headache. He is alert and oriented  X3. No change in motor skills. Wt Readings from Last 3 Encounters:  02/08/16 168 lb 12.8 oz (76.6 kg)  01/05/16 167 lb (75.8 kg)  12/30/15 167 lb 5 oz (75.9 kg)  BP (!) 142/73   Pulse 66   Temp 98 F (36.7 C) (Oral)   Resp 18   Ht 5\' 10"  (1.778 m)   Wt 168 lb 12.8 oz (76.6 kg)   SpO2 100%   BMI 24.22 kg/m

## 2016-02-09 DIAGNOSIS — I4891 Unspecified atrial fibrillation: Secondary | ICD-10-CM | POA: Diagnosis not present

## 2016-02-10 ENCOUNTER — Other Ambulatory Visit: Payer: Self-pay | Admitting: *Deleted

## 2016-02-10 NOTE — Patient Outreach (Addendum)
  [facility]) Care Management  02/10/2016  VIVIANO FLADGER 1940/10/04 [id number]  Transition of care call  Placed call to patient as part of transition of care program, no answer able to leave a HIPAA compliant message requesting a return call. Unable to address required assessments .  Plan Will await return call if no response , will plan to return call in next week as part of transition of care program.  Joylene Draft, RN, [place] Management [phone]- Mobile [phone]- [place] Office

## 2016-02-11 DIAGNOSIS — E088 Diabetes mellitus due to underlying condition with unspecified complications: Secondary | ICD-10-CM | POA: Diagnosis not present

## 2016-02-11 DIAGNOSIS — I10 Essential (primary) hypertension: Secondary | ICD-10-CM | POA: Diagnosis not present

## 2016-02-11 DIAGNOSIS — E785 Hyperlipidemia, unspecified: Secondary | ICD-10-CM | POA: Diagnosis not present

## 2016-02-11 DIAGNOSIS — C259 Malignant neoplasm of pancreas, unspecified: Secondary | ICD-10-CM | POA: Diagnosis not present

## 2016-02-15 ENCOUNTER — Encounter: Payer: Self-pay | Admitting: *Deleted

## 2016-02-15 ENCOUNTER — Other Ambulatory Visit: Payer: Self-pay | Admitting: *Deleted

## 2016-02-15 DIAGNOSIS — C7931 Secondary malignant neoplasm of brain: Secondary | ICD-10-CM | POA: Diagnosis not present

## 2016-02-15 DIAGNOSIS — C250 Malignant neoplasm of head of pancreas: Secondary | ICD-10-CM | POA: Diagnosis not present

## 2016-02-15 NOTE — Patient Outreach (Signed)
[place] [facility]) Care Management  02/15/2016   Christopher Clark 01/25/1941 [id number]  Transition of care call  Subjective:  Placed call to patient as part of transition of care program, patient states he is currently at [facility] center receiving a treatment, agreeable to receiving a phone call in the next 2 days.   Current Medications:  Current Outpatient Prescriptions  Medication Sig Dispense Refill  . aspirin 325 MG tablet Take 325 mg by mouth daily.    . cholecalciferol (VITAMIN D) 1000 UNITS tablet Take 1,000 Units by mouth daily.    . Cyanocobalamin (VITAMIN B 12 PO) Take 1 tablet by mouth daily.    Marland Kitchen diltiazem (CARDIZEM CD) 120 MG 24 hr capsule Take 120 mg by mouth daily.    Marland Kitchen diltiazem (CARDIZEM SR) 120 MG 12 hr capsule Take 120 mg by mouth 2 (two) times daily.    . famotidine (PEPCID) 20 MG tablet Take 1 tablet (20 mg total) by mouth 2 (two) times daily. 60 tablet 0  . HYDROcodone-acetaminophen (NORCO/VICODIN) 5-325 MG tablet Take 1-2 tablets by mouth every 4 (four) hours as needed for moderate pain. (Patient not taking: Reported on 02/08/2016) 30 tablet 0  . insulin glargine (LANTUS) 100 UNIT/ML injection Inject 45 Units into the skin at bedtime.     Marland Kitchen lisinopril (PRINIVIL,ZESTRIL) 20 MG tablet Take 10 mg by mouth every morning.     . lovastatin (MEVACOR) 20 MG tablet Take 20 mg by mouth daily.    . metFORMIN (GLUCOPHAGE) 500 MG tablet Take 500-1,000 mg by mouth 2 (two) times daily with a meal. Takes 1000 mg in the morning and 500 mg in the evening     No current facility-administered medications for this visit.     Functional Status:  In your present state of health, do you have any difficulty performing the following activities: 12/31/2015 12/29/2015  Hearing? N N  Vision? N N  Difficulty concentrating or making decisions? N N  Walking or climbing stairs? N Y  Dressing or bathing? N N  Doing errands, shopping? N N  Some recent data might be hidden     Fall/Depression Screening: PHQ 2/9 Scores 02/08/2016 01/18/2016 12/25/2015  PHQ - 2 Score 0 3 0  PHQ- 9 Score 1 13 -    Assessment:  Patient with ongoing chemotherapy treatments  Plan:  Will place on schedule to call patient in the next 2 days as part of transition of care process.  Joylene Draft, RN, [place] Management [phone]- Mobile [phone]- Toll Free Main Office

## 2016-02-16 ENCOUNTER — Other Ambulatory Visit: Payer: Self-pay | Admitting: *Deleted

## 2016-02-16 NOTE — Patient Outreach (Addendum)
[place] [facility]) Care Management  02/16/2016  ISHMEAL RORIE 1940-05-19 [id number]   Placed call to patient as part of transition of care program, patient reports that he is doing well on today.  Patient discussed his visit to oncologist on yesterday and chemotherapy treatment infusion that he received via his porta cath, and to be completed by 11/8 when return to oncology office. Patient has discussed his recent visit with surgeon and being pleased with his progress.   Mr.Pallett reports that he had his first visit with cardiologist,rhythm remains regular and no new medication changes. Patient denies having any new symptoms, he continues to monitor his blood pressure at home with recent reading 140/70.   Patient reports tolerating daily activity at home, no recent falls, appetite is good.Patient reports managing well at home, Mr.Viglione continues to decline need for home visit from care manager, agreeable to continue transition of care calls.   Fall Risk  02/16/2016 02/08/2016 01/20/2016 12/25/2015  Falls in the past year? Yes No Yes No  Number falls in past yr: _0 -  Injury with Fall? Yes Yes Yes -  Risk Factor Category  High Fall Risk - - -  Risk for fall due to : History of fall(s) (No Data) Impaired balance/gait;History of fall(s) Impaired balance/gait  Risk for fall due to (comments): - Thinks he hit a rug that caused the fall - -  Follow up Falls prevention discussed Falls evaluation completed Falls prevention discussed -   Depression screen [facility] 2/9 02/16/2016 02/08/2016 01/18/2016 12/25/2015  Decreased Interest 0 0 2 0  Down, Depressed, Hopeless 0 0 1 0  PHQ - 2 Score 0 0 3 0  Altered sleeping - 1 3 -  Tired, decreased energy - 0 3 -  Change in appetite - 0 1 -  Feeling bad or failure about yourself  - 0 1 -  Trouble concentrating - 0 1 -  Moving slowly or fidgety/restless - 0 1 -  Suicidal thoughts - 0 0 -  PHQ-9 Score - 1 13 -  Difficult doing work/chores - -  Very difficult -   THN CM Care Plan Problem One   Flowsheet Row Most Recent Value  Care Plan Problem One  Recent hospitalization high risk for readmission  Role Documenting the Problem One  Care Management Coordinator  Care Plan for Problem One  Active  THN Long Term Goal (31-90 days)  Patient will not experience hospital admission in the next 31 days   THN Long Term Goal Start Date  01/29/16 [name] date restarted patient experienced readmission]  Interventions for Problem One Long Term Goal  Discussed importance of notifying MD of new or worsening symptoms, taking medications as prescribed.   THN CM Short Term Goal #1 (0-30 days)  Patient will continue to check blood sugar daily and record in the next 30 days   THN CM Short Term Goal #1 Start Date  01/29/16  Interventions for Short Term Goal #1  Discussed importance of keeping a log of readings and notifying MD of consistent elevations  THN CM Short Term Goal #2 (0-30 days)  Patient will be able to state at least 2 fall prevention measures in the next 30 days   THN CM Short Term Goal #2 Start Date  01/29/16 [name] date restarted due to admission]  [facility] CM Short Term Goal #2 Met Date  02/16/16  THN CM Short Term Goal #3 (0-30 days)  Patient will attend PCP visit within 5  days of discharge   THN CM Short Term Goal #3 Start Date  01/29/16  [facility] CM Short Term Goal #3 Met Date  02/16/16      Plan Will continue to follow up with patient weekly as part of transition of care program.  Will send this telephone visit note to PCP. Patient has declined need for home visit.   Joylene Draft, RN, [place] Management [phone]- Mobile [phone]- Toll Free Main Office

## 2016-02-17 DIAGNOSIS — C250 Malignant neoplasm of head of pancreas: Secondary | ICD-10-CM | POA: Diagnosis not present

## 2016-02-17 DIAGNOSIS — C787 Secondary malignant neoplasm of liver and intrahepatic bile duct: Secondary | ICD-10-CM | POA: Diagnosis not present

## 2016-02-17 DIAGNOSIS — Z452 Encounter for adjustment and management of vascular access device: Secondary | ICD-10-CM | POA: Diagnosis not present

## 2016-02-18 ENCOUNTER — Other Ambulatory Visit: Payer: Self-pay | Admitting: Radiation Therapy

## 2016-02-18 DIAGNOSIS — C7931 Secondary malignant neoplasm of brain: Secondary | ICD-10-CM

## 2016-02-18 DIAGNOSIS — C7949 Secondary malignant neoplasm of other parts of nervous system: Principal | ICD-10-CM

## 2016-02-20 DIAGNOSIS — R0789 Other chest pain: Secondary | ICD-10-CM | POA: Diagnosis not present

## 2016-02-20 DIAGNOSIS — E119 Type 2 diabetes mellitus without complications: Secondary | ICD-10-CM | POA: Diagnosis not present

## 2016-02-20 DIAGNOSIS — K219 Gastro-esophageal reflux disease without esophagitis: Secondary | ICD-10-CM | POA: Diagnosis not present

## 2016-02-20 DIAGNOSIS — Z794 Long term (current) use of insulin: Secondary | ICD-10-CM | POA: Diagnosis not present

## 2016-02-20 DIAGNOSIS — Z79899 Other long term (current) drug therapy: Secondary | ICD-10-CM | POA: Diagnosis not present

## 2016-02-20 DIAGNOSIS — I10 Essential (primary) hypertension: Secondary | ICD-10-CM | POA: Diagnosis not present

## 2016-02-20 DIAGNOSIS — C259 Malignant neoplasm of pancreas, unspecified: Secondary | ICD-10-CM | POA: Diagnosis not present

## 2016-02-20 DIAGNOSIS — C787 Secondary malignant neoplasm of liver and intrahepatic bile duct: Secondary | ICD-10-CM | POA: Diagnosis not present

## 2016-02-20 DIAGNOSIS — C7931 Secondary malignant neoplasm of brain: Secondary | ICD-10-CM | POA: Diagnosis not present

## 2016-02-20 DIAGNOSIS — E785 Hyperlipidemia, unspecified: Secondary | ICD-10-CM | POA: Diagnosis not present

## 2016-02-23 ENCOUNTER — Other Ambulatory Visit: Payer: Self-pay | Admitting: *Deleted

## 2016-02-23 NOTE — Patient Outreach (Signed)
[facility]) Care Management  02/23/2016  Christopher Clark [birth year] [id number]   Transition of care call  Spoke with patient reports all is going well doing good on today. Patient reports able to do his usual activities. Patient denies any new concerns, continues to decline need for a home visit. Patient states he continues to monitor his blood pressure and heart rate and reading are staying about the same denies episodes of feeling like heart beating faster or dizziness.   Patient is agreeable to receiving written information about atrial fibrillation.   Plan Will place final transition of care call on next week, and then follow plan for complex care management additional 30 days, with home visit if patient agrees. Will place in mail, EMMI and [employer] information booklet on atrial fibrillation   THN CM Care Plan Problem One   Flowsheet Row Most Recent Value  Care Plan Problem One  Recent hospitalization high risk for readmission  Role Documenting the Problem One  Care Management [place] for Problem One  Active  THN Long Term Goal (31-90 days)  Patient will not experience hospital admission in the next 31 days   THN Long Term Goal Start Date  01/29/16 [goal date restarted patient experienced readmission]  Interventions for Problem One Long Term Goal  reinforced importance of taking medication as prescribed, and notifying MD of symptoms of concern   THN CM Short Term Goal #1 (0-30 days)  Patient will continue to check blood sugar daily and record in the next 30 days   THN CM Short Term Goal #1 Start Date  01/29/16  [facility] CM Short Term Goal #1 Met Date  02/23/16  THN CM Short Term Goal #2 (0-30 days)  Patient will be able to state at least 2 fall prevention measures in the next 30 days   THN CM Short Term Goal #2 Start Date  01/29/16 [name] date restarted due to [place] CM Short Term Goal #2 Met Date  02/16/16  THN CM Short Term Goal #3 (0-30 days)   Patient will attend PCP visit within 5 days of discharge   [facility] CM Short Term Goal #3 Start Date  01/29/16  [facility] CM Short Term Goal #3 Met Date  02/16/16     Joylene Draft, RN, [place] Management [phone]- Mobile [phone]- [place] .

## 2016-03-02 ENCOUNTER — Other Ambulatory Visit: Payer: Self-pay | Admitting: *Deleted

## 2016-03-02 NOTE — Patient Outreach (Signed)
[place] [facility]) Care Management  03/02/2016  YECHESKEL KUREK 1940/10/31 [id number]  Final Transition of care call   Patient reports that he is doing fairly well, managing at home states he gets tired easily , but then he rest.  Discussed receiving [facility] information on Atrial fibrillation reviewed with , teach back on signs of reoccurrence, stroke symptoms, Patient denies any new concerns, blood pressure readings and heart rate stable he continues to take medications as prescribed.   Patient discussed his continued follow up with oncologist with treatments, and upcoming follow up with Dr.Manning with MRI to  review post craniectomy related to cancer follow up. Patient states his wife is able to provided transportation.  Discussed continued follow up for complex case management after transition of care, patient denies care coordination needs at this time  patient has declined need for home visit, though request telephone follow in a few weeks and agreeable to receiving additional information on diabetes.  Plan Will close to transition of care and continue to follow for complex case management.  Patient centered Care planning goal setting reviewed.  Plan next telephone follow up in the next 3 weeks.  [facility] CM Care Plan Problem One   Flowsheet Row Most Recent Value  Care Plan Problem One  Diabetes self care knowledge deficits   Role Documenting the Problem One  Care Management Coordinator  Care Plan for Problem One  Active  THN Long Term Goal (31-90 days)  Patient will report increased knowledge of Diabetes self care managment  in the next 31 days   THN Long Term Goal Start Date  03/02/16  [facility] Long Term Goal Met Date  03/02/16  Interventions for Problem One Long Term Goal  Discussed importance of continued timely MD visit, taking medications as prescribed.  THN CM Short Term Goal #1 (0-30 days)  Patient will continue to check blood sugar daily and record in the next 30 days   THN  CM Short Term Goal #1 Start Date  03/02/16  [facility] CM Short Term Goal #1 Met Date  02/23/16  Interventions for Short Term Goal #1  Reinforced continued monitoring of blood sugar, keeping a record and taking to PCP visit   THN CM Short Term Goal #2 (0-30 days)  Patient will be able to report increased knowledge of food choices in preparing meals in the next 30 days   THN CM Short Term Goal #2 Start Date  03/02/16  [facility] CM Short Term Goal #2 Met Date  02/16/16  Interventions for Short Term Goal #2  Send EMMI education on Diabetes Diet meal planning.   THN CM Short Term Goal #3 (0-30 days)  Patient will be able to report increasing activity as tolerated in the next 30 days   THN CM Short Term Goal #3 Start Date  03/02/16  [facility] CM Short Term Goal #3 Met Date  02/16/16  Interventions for Short Tern Goal #3  Discussed benefit of exercise and activity to help with controlling diabetes        Joylene Draft, RN, [place] Management [phone]- Mobile [phone]- [place]

## 2016-03-17 DIAGNOSIS — J0100 Acute maxillary sinusitis, unspecified: Secondary | ICD-10-CM | POA: Diagnosis not present

## 2016-03-23 ENCOUNTER — Other Ambulatory Visit: Payer: Self-pay | Admitting: *Deleted

## 2016-03-23 NOTE — Patient Outreach (Signed)
[facility]) Care Management  03/23/2016  KAILUM OUIMETTE 1941-01-01 [id number]  Telephone assessment   Spoke with patient, reports that he is doing pretty good. Patient discussed that is tolerating his daily activity but gets tired easier. Patient reports he is able to walk in the mall once a week about 3 rounds when normally he would do more. Patient continues to monitor his blood pressure daily, recent reading 135/87, states heart less than 100. Patient denies any episodes of feeling as if heart beating faster or dizziness.  Patient states his blood sugar today was 171, and highest reading was 325, with average around 200. Patient discussed that he would be interested in attending a class on diabetes education maybe the first on next year. Verified that Patient has received [facility] diabetes information mailed to him.   Mr.Moshier states he gets a round of chemotherapy starting on Monday. Discussed cancer center is working on renewing his VA benefits to for coverage for chemotherapy, reports that they had to go through this process on last year.  Patient has declined home visit during transition of care, states he will consider it the next time we visit over the phone.   Plan Will follow up with patient in a 2 weeks by telephone to discuss home visit, provide information on Diabetes education classes .   Joylene Draft, RN, [place] Management [phone]- Mobile [phone]- Toll Free Main Office

## 2016-03-26 DIAGNOSIS — I4891 Unspecified atrial fibrillation: Secondary | ICD-10-CM | POA: Diagnosis not present

## 2016-03-26 DIAGNOSIS — E559 Vitamin D deficiency, unspecified: Secondary | ICD-10-CM | POA: Diagnosis not present

## 2016-03-26 DIAGNOSIS — I4892 Unspecified atrial flutter: Secondary | ICD-10-CM | POA: Diagnosis not present

## 2016-03-26 DIAGNOSIS — D649 Anemia, unspecified: Secondary | ICD-10-CM | POA: Diagnosis not present

## 2016-03-26 DIAGNOSIS — E119 Type 2 diabetes mellitus without complications: Secondary | ICD-10-CM | POA: Diagnosis not present

## 2016-03-26 DIAGNOSIS — I10 Essential (primary) hypertension: Secondary | ICD-10-CM | POA: Diagnosis not present

## 2016-03-26 DIAGNOSIS — E1122 Type 2 diabetes mellitus with diabetic chronic kidney disease: Secondary | ICD-10-CM | POA: Diagnosis not present

## 2016-03-26 DIAGNOSIS — C259 Malignant neoplasm of pancreas, unspecified: Secondary | ICD-10-CM | POA: Diagnosis not present

## 2016-03-26 DIAGNOSIS — E785 Hyperlipidemia, unspecified: Secondary | ICD-10-CM | POA: Diagnosis not present

## 2016-03-26 DIAGNOSIS — K219 Gastro-esophageal reflux disease without esophagitis: Secondary | ICD-10-CM | POA: Diagnosis not present

## 2016-03-26 DIAGNOSIS — R079 Chest pain, unspecified: Secondary | ICD-10-CM | POA: Diagnosis not present

## 2016-03-27 DIAGNOSIS — I10 Essential (primary) hypertension: Secondary | ICD-10-CM | POA: Diagnosis not present

## 2016-03-27 DIAGNOSIS — C259 Malignant neoplasm of pancreas, unspecified: Secondary | ICD-10-CM | POA: Diagnosis not present

## 2016-03-27 DIAGNOSIS — D649 Anemia, unspecified: Secondary | ICD-10-CM | POA: Diagnosis not present

## 2016-03-27 DIAGNOSIS — E1122 Type 2 diabetes mellitus with diabetic chronic kidney disease: Secondary | ICD-10-CM | POA: Diagnosis not present

## 2016-03-27 DIAGNOSIS — Z8249 Family history of ischemic heart disease and other diseases of the circulatory system: Secondary | ICD-10-CM | POA: Diagnosis not present

## 2016-03-27 DIAGNOSIS — R079 Chest pain, unspecified: Secondary | ICD-10-CM | POA: Diagnosis not present

## 2016-03-27 DIAGNOSIS — I4892 Unspecified atrial flutter: Secondary | ICD-10-CM | POA: Diagnosis not present

## 2016-03-27 DIAGNOSIS — K219 Gastro-esophageal reflux disease without esophagitis: Secondary | ICD-10-CM | POA: Diagnosis not present

## 2016-03-27 DIAGNOSIS — E559 Vitamin D deficiency, unspecified: Secondary | ICD-10-CM | POA: Diagnosis not present

## 2016-03-27 DIAGNOSIS — E785 Hyperlipidemia, unspecified: Secondary | ICD-10-CM | POA: Diagnosis not present

## 2016-03-27 DIAGNOSIS — I4891 Unspecified atrial fibrillation: Secondary | ICD-10-CM | POA: Diagnosis not present

## 2016-03-27 DIAGNOSIS — E119 Type 2 diabetes mellitus without complications: Secondary | ICD-10-CM | POA: Diagnosis not present

## 2016-03-27 DIAGNOSIS — C250 Malignant neoplasm of head of pancreas: Secondary | ICD-10-CM | POA: Diagnosis not present

## 2016-03-28 DIAGNOSIS — K219 Gastro-esophageal reflux disease without esophagitis: Secondary | ICD-10-CM | POA: Diagnosis not present

## 2016-03-28 DIAGNOSIS — I4892 Unspecified atrial flutter: Secondary | ICD-10-CM | POA: Diagnosis not present

## 2016-03-28 DIAGNOSIS — E559 Vitamin D deficiency, unspecified: Secondary | ICD-10-CM | POA: Diagnosis not present

## 2016-03-28 DIAGNOSIS — I4891 Unspecified atrial fibrillation: Secondary | ICD-10-CM | POA: Diagnosis not present

## 2016-03-28 DIAGNOSIS — E119 Type 2 diabetes mellitus without complications: Secondary | ICD-10-CM | POA: Diagnosis not present

## 2016-03-28 DIAGNOSIS — R079 Chest pain, unspecified: Secondary | ICD-10-CM | POA: Diagnosis not present

## 2016-03-28 DIAGNOSIS — E785 Hyperlipidemia, unspecified: Secondary | ICD-10-CM | POA: Diagnosis not present

## 2016-03-28 DIAGNOSIS — I480 Paroxysmal atrial fibrillation: Secondary | ICD-10-CM | POA: Diagnosis not present

## 2016-03-28 DIAGNOSIS — I10 Essential (primary) hypertension: Secondary | ICD-10-CM | POA: Diagnosis not present

## 2016-03-28 DIAGNOSIS — Z79899 Other long term (current) drug therapy: Secondary | ICD-10-CM | POA: Diagnosis not present

## 2016-03-28 DIAGNOSIS — D649 Anemia, unspecified: Secondary | ICD-10-CM | POA: Diagnosis not present

## 2016-03-28 DIAGNOSIS — E1122 Type 2 diabetes mellitus with diabetic chronic kidney disease: Secondary | ICD-10-CM | POA: Diagnosis not present

## 2016-03-28 DIAGNOSIS — C259 Malignant neoplasm of pancreas, unspecified: Secondary | ICD-10-CM | POA: Diagnosis not present

## 2016-03-29 DIAGNOSIS — R079 Chest pain, unspecified: Secondary | ICD-10-CM | POA: Diagnosis not present

## 2016-03-29 DIAGNOSIS — E785 Hyperlipidemia, unspecified: Secondary | ICD-10-CM | POA: Diagnosis not present

## 2016-03-29 DIAGNOSIS — K219 Gastro-esophageal reflux disease without esophagitis: Secondary | ICD-10-CM | POA: Diagnosis not present

## 2016-03-29 DIAGNOSIS — I10 Essential (primary) hypertension: Secondary | ICD-10-CM | POA: Diagnosis not present

## 2016-03-29 DIAGNOSIS — I480 Paroxysmal atrial fibrillation: Secondary | ICD-10-CM | POA: Diagnosis not present

## 2016-03-29 DIAGNOSIS — C259 Malignant neoplasm of pancreas, unspecified: Secondary | ICD-10-CM | POA: Diagnosis not present

## 2016-03-29 DIAGNOSIS — I4891 Unspecified atrial fibrillation: Secondary | ICD-10-CM | POA: Diagnosis not present

## 2016-03-29 DIAGNOSIS — I4892 Unspecified atrial flutter: Secondary | ICD-10-CM | POA: Diagnosis not present

## 2016-03-29 DIAGNOSIS — R0789 Other chest pain: Secondary | ICD-10-CM | POA: Diagnosis not present

## 2016-03-29 DIAGNOSIS — E119 Type 2 diabetes mellitus without complications: Secondary | ICD-10-CM | POA: Diagnosis not present

## 2016-03-29 DIAGNOSIS — E559 Vitamin D deficiency, unspecified: Secondary | ICD-10-CM | POA: Diagnosis not present

## 2016-03-29 DIAGNOSIS — D649 Anemia, unspecified: Secondary | ICD-10-CM | POA: Diagnosis not present

## 2016-03-29 DIAGNOSIS — E1122 Type 2 diabetes mellitus with diabetic chronic kidney disease: Secondary | ICD-10-CM | POA: Diagnosis not present

## 2016-04-06 ENCOUNTER — Other Ambulatory Visit: Payer: Self-pay | Admitting: *Deleted

## 2016-04-06 NOTE — Patient Outreach (Addendum)
[facility]) Care Management  04/06/2016  TOBE KAN 08-Apr-1941 [id number]   Placed call to patient, spoke with [name], wife, Hipaa information verified. She states patient is napping at this time and request return call, patient has MD appointment in am.  Plan Will place return call in the next 2 days to arrange home visit.  Joylene Draft, RN, [place] Management [phone]- Mobile [phone]- Toll Free Main Office

## 2016-04-07 DIAGNOSIS — I4891 Unspecified atrial fibrillation: Secondary | ICD-10-CM | POA: Diagnosis not present

## 2016-04-07 DIAGNOSIS — F5101 Primary insomnia: Secondary | ICD-10-CM | POA: Diagnosis not present

## 2016-04-08 ENCOUNTER — Other Ambulatory Visit: Payer: Self-pay | Admitting: *Deleted

## 2016-04-08 NOTE — Patient Outreach (Signed)
[facility]) Care Management  [facility]  04/08/2016   Christopher Clark [birth year] [id number]  Subjective:  Placed regularly scheduled call to patient today. Patient discussed he was recently admitted to [facility] on 12/12 and discharged on 12/19 related to atrial fibrillation .   Patient reports he continues to have difficulty sleeping at night usually about  2 hours. Patient discussed he had visit to  PCP on this week and was  started a new medication to help him rest at night , he can't provide the name at present but states it didn't work on last night.  Patient complaint of abdominal pain states it hurts all the time but is getting worse, the last few days .Patient denies nausea , no vomiting, not taking pain medication,and states if it doesn't get any better I am going back to the emergency room. Patient asked if there was any way I could help him, offered to notify MD of concern. Patient discussed that he wasn't sure if the pain was related to his pancreas cancer states there was a little growth on last scan.  Patient discussed recent follow up visit with [facility] and scheduled for chemotherapy on next week.   Patient states he is taking medications as prescribed by MD and he doesn't feel like going through list  to review  at this time , but states he is on 2 new medications for his rhythm .  I  Reviewed discharge instructions that include Eliquis, Amiodarone and cardizem.     Plan:  Patient with recent [facility] readmission and is agreeable to weekly follow up for transition of care.  Placed call to Christopher Clark  PCP office regarding patient discomfort office closed at this time. Placed call to [place] office, able to speak  With nurse [facility], she discussed patient has been complaining of abdominal discomfort at visit this week, she will discuss with [facility] and follow up with [facility].  Will return call to patient in next week.  1400 Return call  from Christopher Clark at [facility] office stating she had spoken with patient and to notify him that [facility] has  ordered Loratab for discomfort his wife will pick up prescription at the office today.  [facility] CM Care Plan Problem One   Flowsheet Row Most Recent Value  Care Plan Problem One  High risk for readmission related to recent discharge for atrial fibrillation   Role Documenting the Problem One  Care Management [place] for Problem One  Active  [facility]N Long Term Goal (31-90 days)  Patient will not experience a hosptial readmission in the next 60 days   [facility]N Long Term Goal Start Date  04/08/16  Interventions for Problem One Long Term Goal  Reviewed transiton of care program, importanc of following discharge instructions taking medications as prescribed.   [facility] CM Short Term Goal #1 (0-30 days)  Patient report knowledge of signs of atrial fibrillation to notify MD of in the next 30 days   [facility]N CM Short Term Goal #1 Start Date  04/08/16  [facility] CM Short Term Goal #1 Met Date  03/23/16  Interventions for Short Term Goal #1  Review importance of notifying MD of dizziness, racing heart beat, elevated heart parameters.  [facility] CM Short Term Goal #2 (0-30 days)  Patient will report continuing to monitor and record daily blood pressure and heart rate reading in the next 30 days   [facility]N CM Short Term Goal #2 Start Date  04/08/16  Interventions for  Short Term Goal #2  Review importance of montoring daily and notifying MD of consistent elevation per parameters   [facility]N CM Short Term Goal #3 (0-30 days)  Patient will be able to report increasing activity as tolerated in the next 30 days   [facility]N CM Short Term Goal #3 Start Date  03/02/16  Interventions for Short Tern Goal #3  Reinforced beneifts of activity as tolerated to help toward lowering blood sugar   [facility]N CM Short Term Goal #4 (0-30 days)  Patient will report scheduling to attend Diabetes class in the next 30 days   [facility]N CM Short Term Goal #4 Start Date  03/23/16   Interventions for Short Term Goal #4  Will provide patient with infomation on [facility] diabetes education classes at next visit .          Christopher Draft, RN, [place] Management [phone]- Mobile [phone]- Toll Free Main Office

## 2016-04-14 ENCOUNTER — Ambulatory Visit (HOSPITAL_COMMUNITY): Payer: No Typology Code available for payment source

## 2016-04-15 ENCOUNTER — Other Ambulatory Visit: Payer: Self-pay | Admitting: *Deleted

## 2016-04-15 NOTE — Patient Outreach (Signed)
[facility]) Care Management  04/15/2016  Christopher Clark May 03, 1940 [id number]  Transition of care call   Placed call to patient,he discussed that he is not feeling well, discussed his visit to oncologist on this week.. Patient also discussed his recent visit to emergency room on 12/31 for increased heart rate and medication changes made.   Mr.Christopher Clark, requested that I speak with his wife Christopher [facility]. She was able to review medications that patient is taking , she verbalized being unsure regarding Diltiazem 120 mg CR , she had 2 bottles and thinks patient is taking one in morning and one in the evening. She verbalized that amiodarone has been stopped.  Patient reports his heart rate today is 29, they use a pulse oximetry to record. Mrs.Christopher Clark able to verbalize how to use  cardizem immediate release for sustained  heart rate greater than 120 that was added at recent emergency room visit.  Mrs.Christopher Clark reports that they are not using a pill organizer at this time , but was thinking about getting one, discussed benefits of this and I would be able to assist them with this at visit if they do not have organizer prior to then. She discussed concern regarding liver enzymes being elevated and states they have follow up appointment with [facility] on next week he was not able to have his chemotherapy on this week related to that.   Patient had been experiencing some constipation but that has been relieved.   Agreeable to home visit .Patient had to cancel office visit with [facility] Clark for 1/4, will reschedule .     Plan Placed call to [facility] Clark office, spoke with [facility] Clark, verified Cardizem dose is 120 mg daily on 12/28. Placed call to [place], cardiologist, spoke with Christopher Clark at office , confirmed dose of Cardizem CR 120 mg daily as of 1/3 , reinforced this with Mrs.[facility]. Patient will notify MD of worsening symptoms  Scheduled home visit for next week. Patient to reschedule visit with  [facility].  Christopher Draft, RN, [place] Management [phone]- Mobile [phone]- Toll Free Main Office

## 2016-04-18 ENCOUNTER — Encounter: Payer: Self-pay | Admitting: *Deleted

## 2016-04-18 ENCOUNTER — Other Ambulatory Visit: Payer: Self-pay | Admitting: *Deleted

## 2016-04-18 ENCOUNTER — Ambulatory Visit: Payer: No Typology Code available for payment source | Admitting: Radiation Oncology

## 2016-04-18 NOTE — Patient Outreach (Addendum)
[facility]) Care Management   04/18/2016  Christopher Clark 10-09-1940 [id number]  Christopher Clark is an 76 y.o. male  Subjective:  Patient discussed abdominal discomfort he has been having for a few weeks and recent emergency room visits and reported recent diagnosis of common bile duct obstruction as patient/wife pointed out on discharge paper work. . Patient reports that he is awaiting referral from [place]  to a surgeon in [place] regarding possibly needing a stent for his bile duct to help relieve the discomfort. Patient taking prn oxycodone at least once daily with some relief, he denies having any vomiting in the last week and tolerating small amounts of foods. Patient discussed being tired and lacking energy.,  Patient discussed he has appointment at [facility] office in am, doesn't believe he will be up to taking chemotherapy until the situation with stomach pain is resolved.  Patient reports his constipation has been relieved after taking miralax.  Patient reports this heart  rate has been doing good the last few days, taking medications as prescribed. Patient discussed his recent visits to [facility] emergency room related to increased heart rate and recent medication changes , stopping amiodarone.     Objective:  BP 134/70 (BP Location: Right Arm, Patient Position: Sitting)   Pulse 70   SpO2 98%  Review of Systems  Constitutional: Negative.   HENT: Negative.   Eyes:       Yellowish discolor  Respiratory: Negative.   Cardiovascular: Negative.   Gastrointestinal: Positive for abdominal pain.  Genitourinary: Negative.   Musculoskeletal: Negative.   Skin:       Yellowish color tint to skin   Neurological: Negative.   Endo/Heme/Allergies: Negative.   Psychiatric/Behavioral: The patient is nervous/anxious.     Physical Exam  Constitutional: He appears well-developed and well-nourished.  Eyes: Scleral icterus is present.  Light yellow discoloration  to eyes.  Cardiovascular: Normal rate.  An irregular rhythm present.    Encounter Medications:   Outpatient Encounter Prescriptions as of 04/18/2016  Medication Sig Note  . apixaban (ELIQUIS) 5 MG TABS tablet Take 5 mg by mouth 2 (two) times daily.   . cholecalciferol (VITAMIN D) 1000 UNITS tablet Take 1,000 Units by mouth daily.   . Cyanocobalamin (VITAMIN B 12 PO) Take 1 tablet by mouth daily.   Marland Kitchen diltiazem (CARDIZEM CD) 120 MG 24 hr capsule Take 120 mg by mouth daily.   . famotidine (PEPCID) 20 MG tablet Take 1 tablet (20 mg total) by mouth 2 (two) times daily.   . insulin glargine (LANTUS) 100 UNIT/ML injection Inject 45 Units into the skin at bedtime.    Marland Kitchen lisinopril (PRINIVIL,ZESTRIL) 20 MG tablet Take 10 mg by mouth every morning.    Marland Kitchen LORazepam (ATIVAN) 1 MG tablet Take 1 mg by mouth every 8 (eight) hours as needed for anxiety. Every 8 to 12 hours as needed for insomnia   . lovastatin (MEVACOR) 20 MG tablet Take 20 mg by mouth daily.   . metFORMIN (GLUCOPHAGE) 500 MG tablet Take 500-1,000 mg by mouth 2 (two) times daily with a meal. Takes 1000 mg in the morning and 500 mg in the evening   . oxycodone (OXY-IR) 5 MG capsule Take 5 mg by mouth every 4 (four) hours as needed.   Marland Kitchen aspirin 325 MG tablet Take 325 mg by mouth daily. 04/18/2016: Not taking  . diltiazem (CARDIZEM SR) 120 MG 12 hr capsule Take 120 mg by mouth 2 (two) times daily. 01/29/2016: Not  taking  . HYDROcodone-acetaminophen (NORCO/VICODIN) 5-325 MG tablet Take 1-2 tablets by mouth every 4 (four) hours as needed for moderate pain. (Patient not taking: Reported on 04/18/2016)   . traZODone (DESYREL) 50 MG tablet Take 50 mg by mouth at bedtime.    No facility-administered encounter medications on file as of 04/18/2016.     Functional Status:   In your present state of health, do you have any difficulty performing the following activities: 04/18/2016 02/16/2016  Hearing? N N  Vision? N N  Difficulty concentrating or making  decisions? N N  Walking or climbing stairs? Y Y  Dressing or bathing? N N  Doing errands, shopping? [place]  Preparing Food and eating ? N N  Using the Toilet? N N  In the past six months, have you accidently leaked urine? N N  Do you have problems with loss of bowel control? N N  Managing your Medications? N N  Managing your Finances? N N  Housekeeping or managing your Housekeeping? N N  Some recent data might be hidden    Fall/Depression Screening:    PHQ 2/9 Scores 04/18/2016 02/16/2016 02/08/2016 01/18/2016 12/25/2015  PHQ - 2 Score 1 0 0 3 0  PHQ- 9 Score - - 1 13 -   Fall Risk  04/18/2016 02/16/2016 02/08/2016 01/20/2016 12/25/2015  Falls in the past year? Yes Yes No Yes No  Number falls in past yr: 1 1 1 1  -  Injury with Fall? Yes Yes Yes Yes -  Risk Factor Category  High Fall Risk High Fall Risk - - -  Risk for fall due to : History of fall(s) History of fall(s) (No Data) Impaired balance/gait;History of fall(s) Impaired balance/gait  Risk for fall due to (comments): - - Thinks he hit a rug that caused the fall - -  Follow up Falls prevention discussed Falls prevention discussed Falls evaluation completed Falls prevention discussed -   Assessment:  Initial transition of care home visit   Atrial Fibrillation - rate controlled, taking medication at prescribed, verbalized understanding of Cardizem 60 mg for heart rate increase greater than 120. Patient monitoring heart rate daily Will benefit from pill organizer.  Abdominal pain-  Relieved of constipation , taking daily miralax daily  Awaiting referral appointment regarding further evaluation of common bile duct obstruction. Taking oxycodone prn with some relief.  Pancreatic cancer - continuing follow up with oncologist   Diabetes Monitoring at least twice daily and keeping a record, this am reading 170,   Anxiety-  Takes ativan at least once a day at night , reports being sleepy during the day. Patient is not taking trazodone  .   Patient has follow up appointment/Labs  with Dr.Lewis in am  Discussed importance of scheduling follow up visit with Dr.Penner, previous visit canceled due to weather on 1/4,  patient and wife verbalized understanding  and states they will schedule return office visit after they  hear from referral related to abdominal pain /possible stenting .   Plan:  Provided and placed medication in pill organizer Patient 's wife will discuss with  Dr.Lewis at visit in am she doesn't receive a call today  regarding referral for  Abdominal pain. Patient will notify MD of worsening of symptoms . Plan transition of care telephone outreach in the next week.  Will send visit note to PCP.   [facility] CM Care Plan Problem One   Flowsheet Row Most Recent Value  Care Plan Problem One  High risk for readmission  related to recent discharge for atrial fibrillation   Role Documenting the Problem One  Care Management [place] for Problem One  Active  THN Long Term Goal (31-90 days)  Patient will not experience a hosptial readmission in the next 60 days   THN Long Term Goal Start Date  04/08/16  Interventions for Problem One Long Term Goal  Provide and review EMMI hanout on treating atrial fibrillation, reinforced with patient to notify MD of worsening of symptoms and seek medical attention   THN CM Short Term Goal #1 (0-30 days)  Patient report knowledge of signs of atrial fibrillation to notify MD of in the next 30 days   THN CM Short Term Goal #1 Start Date  04/08/16  Interventions for Short Term Goal #1  Reviewed parameters of heart rate and action plan, Provided and review atrial fib booklet mediication treatment, symptoms   THN CM Short Term Goal #2 (0-30 days)  Patient will report continuing to monitor and record daily blood pressure and heart rate reading in the next 30 days   THN CM Short Term Goal #2 Start Date  04/08/16  Interventions for Short Term Goal #2  Discussed importance of continuing  to monitor heart rate reading ,        Joylene Draft, RN, [place] Management [phone]- Mobile [phone]- [place]

## 2016-04-19 DIAGNOSIS — Z7984 Long term (current) use of oral hypoglycemic drugs: Secondary | ICD-10-CM | POA: Diagnosis not present

## 2016-04-19 DIAGNOSIS — I480 Paroxysmal atrial fibrillation: Secondary | ICD-10-CM | POA: Diagnosis not present

## 2016-04-19 DIAGNOSIS — I129 Hypertensive chronic kidney disease with stage 1 through stage 4 chronic kidney disease, or unspecified chronic kidney disease: Secondary | ICD-10-CM | POA: Diagnosis not present

## 2016-04-19 DIAGNOSIS — C259 Malignant neoplasm of pancreas, unspecified: Secondary | ICD-10-CM | POA: Diagnosis not present

## 2016-04-19 DIAGNOSIS — R17 Unspecified jaundice: Secondary | ICD-10-CM | POA: Diagnosis not present

## 2016-04-19 DIAGNOSIS — Z7901 Long term (current) use of anticoagulants: Secondary | ICD-10-CM | POA: Diagnosis not present

## 2016-04-19 DIAGNOSIS — R109 Unspecified abdominal pain: Secondary | ICD-10-CM | POA: Diagnosis not present

## 2016-04-19 DIAGNOSIS — K831 Obstruction of bile duct: Secondary | ICD-10-CM | POA: Diagnosis not present

## 2016-04-19 DIAGNOSIS — Z79891 Long term (current) use of opiate analgesic: Secondary | ICD-10-CM | POA: Diagnosis not present

## 2016-04-19 DIAGNOSIS — N183 Chronic kidney disease, stage 3 (moderate): Secondary | ICD-10-CM | POA: Diagnosis not present

## 2016-04-19 DIAGNOSIS — Z79899 Other long term (current) drug therapy: Secondary | ICD-10-CM | POA: Diagnosis not present

## 2016-04-19 DIAGNOSIS — E1122 Type 2 diabetes mellitus with diabetic chronic kidney disease: Secondary | ICD-10-CM | POA: Diagnosis not present

## 2016-04-19 DIAGNOSIS — Z794 Long term (current) use of insulin: Secondary | ICD-10-CM | POA: Diagnosis not present

## 2016-04-19 DIAGNOSIS — C250 Malignant neoplasm of head of pancreas: Secondary | ICD-10-CM | POA: Diagnosis not present

## 2016-04-19 DIAGNOSIS — R1013 Epigastric pain: Secondary | ICD-10-CM | POA: Diagnosis not present

## 2016-04-19 DIAGNOSIS — K5909 Other constipation: Secondary | ICD-10-CM | POA: Diagnosis not present

## 2016-04-19 DIAGNOSIS — I10 Essential (primary) hypertension: Secondary | ICD-10-CM | POA: Diagnosis not present

## 2016-04-19 DIAGNOSIS — C787 Secondary malignant neoplasm of liver and intrahepatic bile duct: Secondary | ICD-10-CM | POA: Diagnosis not present

## 2016-04-19 DIAGNOSIS — R748 Abnormal levels of other serum enzymes: Secondary | ICD-10-CM | POA: Diagnosis not present

## 2016-04-20 DIAGNOSIS — I10 Essential (primary) hypertension: Secondary | ICD-10-CM | POA: Diagnosis not present

## 2016-04-20 DIAGNOSIS — C259 Malignant neoplasm of pancreas, unspecified: Secondary | ICD-10-CM | POA: Diagnosis not present

## 2016-04-20 DIAGNOSIS — K831 Obstruction of bile duct: Secondary | ICD-10-CM | POA: Diagnosis not present

## 2016-04-20 DIAGNOSIS — R109 Unspecified abdominal pain: Secondary | ICD-10-CM | POA: Diagnosis not present

## 2016-04-20 DIAGNOSIS — I480 Paroxysmal atrial fibrillation: Secondary | ICD-10-CM | POA: Diagnosis not present

## 2016-04-20 DIAGNOSIS — E1122 Type 2 diabetes mellitus with diabetic chronic kidney disease: Secondary | ICD-10-CM | POA: Diagnosis not present

## 2016-04-20 DIAGNOSIS — R17 Unspecified jaundice: Secondary | ICD-10-CM | POA: Diagnosis not present

## 2016-04-21 ENCOUNTER — Ambulatory Visit (HOSPITAL_COMMUNITY): Admission: RE | Admit: 2016-04-21 | Payer: PPO | Source: Ambulatory Visit

## 2016-04-21 DIAGNOSIS — I480 Paroxysmal atrial fibrillation: Secondary | ICD-10-CM | POA: Diagnosis not present

## 2016-04-21 DIAGNOSIS — I10 Essential (primary) hypertension: Secondary | ICD-10-CM | POA: Diagnosis not present

## 2016-04-21 DIAGNOSIS — E1122 Type 2 diabetes mellitus with diabetic chronic kidney disease: Secondary | ICD-10-CM | POA: Diagnosis not present

## 2016-04-21 DIAGNOSIS — R17 Unspecified jaundice: Secondary | ICD-10-CM | POA: Diagnosis not present

## 2016-04-22 DIAGNOSIS — I10 Essential (primary) hypertension: Secondary | ICD-10-CM | POA: Diagnosis not present

## 2016-04-22 DIAGNOSIS — I480 Paroxysmal atrial fibrillation: Secondary | ICD-10-CM | POA: Diagnosis not present

## 2016-04-22 DIAGNOSIS — R17 Unspecified jaundice: Secondary | ICD-10-CM | POA: Diagnosis not present

## 2016-04-22 DIAGNOSIS — E1122 Type 2 diabetes mellitus with diabetic chronic kidney disease: Secondary | ICD-10-CM | POA: Diagnosis not present

## 2016-04-23 DIAGNOSIS — I480 Paroxysmal atrial fibrillation: Secondary | ICD-10-CM | POA: Diagnosis not present

## 2016-04-23 DIAGNOSIS — K831 Obstruction of bile duct: Secondary | ICD-10-CM | POA: Diagnosis not present

## 2016-04-23 DIAGNOSIS — I10 Essential (primary) hypertension: Secondary | ICD-10-CM | POA: Diagnosis not present

## 2016-04-23 DIAGNOSIS — R17 Unspecified jaundice: Secondary | ICD-10-CM | POA: Diagnosis not present

## 2016-04-23 DIAGNOSIS — C250 Malignant neoplasm of head of pancreas: Secondary | ICD-10-CM | POA: Diagnosis not present

## 2016-04-23 DIAGNOSIS — C787 Secondary malignant neoplasm of liver and intrahepatic bile duct: Secondary | ICD-10-CM | POA: Diagnosis not present

## 2016-04-23 DIAGNOSIS — E1122 Type 2 diabetes mellitus with diabetic chronic kidney disease: Secondary | ICD-10-CM | POA: Diagnosis not present

## 2016-04-23 DIAGNOSIS — C221 Intrahepatic bile duct carcinoma: Secondary | ICD-10-CM | POA: Diagnosis not present

## 2016-04-25 ENCOUNTER — Ambulatory Visit
Admission: RE | Admit: 2016-04-25 | Payer: No Typology Code available for payment source | Source: Ambulatory Visit | Admitting: Radiation Oncology

## 2016-04-25 ENCOUNTER — Encounter: Payer: Self-pay | Admitting: *Deleted

## 2016-04-25 ENCOUNTER — Other Ambulatory Visit: Payer: Self-pay | Admitting: *Deleted

## 2016-04-25 NOTE — Patient Outreach (Signed)
[facility]) Care Management  04/25/2016  Christopher Clark 1941/01/01 [id number]  Admitted 1/9 [facility] :CBD obstruction, Jaundice, elevated bilirubin and inpatient procedure  ERCP & biliary stent Discharged to home 1/13.  Transition of care call  Patient reports felling better since procedure, complaint of constipation on today, and has taken prescribed medications. Patient denies abdominal pain or nausea, taking a little time for appetite to return , no fever.    Patient was recently discharged from hospital and all medications have been reviewed.   Patient denies any new concerns at this time, he has scheduled PCP post hospital visit with Dr.Penner and has follow up appointment with Dr.Lewis.   Plan Will follow up with weekly transition of care call in the next week.   [facility]N CM Care Plan Problem One   Flowsheet Row Most Recent Value  Care Plan Problem One  High risk for readmission related to recent hospital admission   Role Documenting the Problem One  Care Management [place] for Problem One  Active  THN Long Term Goal (31-90 days)  Patient will not experience a hosptial readmission in the next 31  days   THN Long Term Goal Start Date  04/25/16  Interventions for Problem One Long Term Goal  Discussed importance of taking medications as prescribing, following up with PCP post hospitalization, review reasons to notify MD of worsening symptoms pain not relieved by medication and temperture over 101.  THN CM Short Term Goal #1 (0-30 days)  Patient report knowledge of signs of atrial fibrillation to notify MD of in the next 30 days   THN CM Short Term Goal #1 Start Date  04/25/16 [goal restarted ]  Interventions for Short Term Goal #1  Reinforced parameters of heart rate and action plan, Provided and review atrial fib booklet mediication treatment, symptoms   THN CM Short Term Goal #2 (0-30 days)  Patient will report continuing to monitor and record  daily blood pressure and heart rate reading in the next 30 days   THN CM Short Term Goal #2 Start Date  04/25/16  Interventions for Short Term Goal #2  Reinforced importance of continuing to monitor heart rate reading ,   THN CM Short Term Goal #3 (0-30 days)  Patient will be able to report increasing activity as tolerated in the next 30 days   THN CM Short Term Goal #3 Start Date  04/25/16  Interventions for Short Tern Goal #3  Discussed importance of increasing activitiy as tolerated, to help improve strength balance, help with preventing constipation      Joylene Draft, RN, [place] Management [phone]- Mobile [phone]- [place]

## 2016-04-29 DIAGNOSIS — R945 Abnormal results of liver function studies: Secondary | ICD-10-CM | POA: Diagnosis not present

## 2016-04-29 DIAGNOSIS — C259 Malignant neoplasm of pancreas, unspecified: Secondary | ICD-10-CM | POA: Diagnosis not present

## 2016-04-29 DIAGNOSIS — C787 Secondary malignant neoplasm of liver and intrahepatic bile duct: Secondary | ICD-10-CM | POA: Diagnosis not present

## 2016-04-29 DIAGNOSIS — R079 Chest pain, unspecified: Secondary | ICD-10-CM | POA: Diagnosis not present

## 2016-05-03 DIAGNOSIS — C250 Malignant neoplasm of head of pancreas: Secondary | ICD-10-CM | POA: Diagnosis not present

## 2016-05-03 DIAGNOSIS — I951 Orthostatic hypotension: Secondary | ICD-10-CM | POA: Diagnosis not present

## 2016-05-03 DIAGNOSIS — R630 Anorexia: Secondary | ICD-10-CM | POA: Diagnosis not present

## 2016-05-03 DIAGNOSIS — C799 Secondary malignant neoplasm of unspecified site: Secondary | ICD-10-CM | POA: Diagnosis not present

## 2016-05-03 DIAGNOSIS — C787 Secondary malignant neoplasm of liver and intrahepatic bile duct: Secondary | ICD-10-CM | POA: Diagnosis not present

## 2016-05-04 ENCOUNTER — Other Ambulatory Visit: Payer: Self-pay | Admitting: *Deleted

## 2016-05-04 NOTE — Patient Outreach (Signed)
[place]) Care Management  05/04/2016  KEELEN NILO 07-21-1940 [id number]  Transition of care call  Called placed able to speak with patient's wife [name], Hipaa information verified. Patient is resting at present, she reports that patient has being doing pretty good for the last few days and no complaint of increased  abdominal pain. She discussed his recent visit to ED for epigastric discomfort on last week, his pain medication has been changed, to Hydromorphone and he only takes it if needed, none needed so far on today.  Patient has followed up with Dr.Lewis on yesterday , received some IV fluids because he was a little dehydrated,he will be reevaluated within a week to see if he is able to received his chemo therapy treatment. Mrs. Schwein reports that patient's appetite is slow to return.and he has been started on prednisone to help , discussed they are pleased with bilirubin count is down and juandice slowly improving. Patient continues to monitor his blood sugars at home reading in the 200's , no hypoglycemia episodes.Patient denies any episodes of feeling if heart rate is increased and monitors it with pulse oximetery.  Patient will reschedule PCP visit with Dr.Penner, and has follow up brain  MRI scan on next week. No new concerns voiced.  Plan Will follow up with in next week for transition of care call  Joylene Draft, RN, [place] Management [phone]- Mobile [phone]- [place] .

## 2016-05-05 DIAGNOSIS — E088 Diabetes mellitus due to underlying condition with unspecified complications: Secondary | ICD-10-CM | POA: Diagnosis not present

## 2016-05-05 DIAGNOSIS — E785 Hyperlipidemia, unspecified: Secondary | ICD-10-CM | POA: Diagnosis not present

## 2016-05-05 DIAGNOSIS — I10 Essential (primary) hypertension: Secondary | ICD-10-CM | POA: Diagnosis not present

## 2016-05-05 DIAGNOSIS — C259 Malignant neoplasm of pancreas, unspecified: Secondary | ICD-10-CM | POA: Diagnosis not present

## 2016-05-09 ENCOUNTER — Ambulatory Visit (HOSPITAL_COMMUNITY): Payer: PPO

## 2016-05-09 NOTE — Progress Notes (Signed)
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  Headache:None Dizziness:Yes if he moves too fast. Nausea/vomiting:None Visual changes/Diplopia:None Ringing in ears:None Fatigue:Having fatigue in the afternoon and different times during the day. Cognitive changes:Forgetful at times. Able to answer all questions today without asking for questions to be repeated. Weight: Wt Readings from Last 3 Encounters:  05/23/16 159 lb (72.1 kg)  05/16/16 160 lb (72.6 kg)  02/08/16 168 lb 12.8 oz (76.6 kg)  BP 107/64   Pulse 70   Temp 98.2 F (36.8 C) (Oral)   Resp 18   Ht 5\' 11"  (1.803 m)   Wt 159 lb (72.1 kg)   SpO2 100%   BMI 22.18 kg/m

## 2016-05-11 ENCOUNTER — Inpatient Hospital Stay
Admission: RE | Admit: 2016-05-11 | Discharge: 2016-05-11 | Disposition: A | Discharge status: Home / Self Care | Payer: No Typology Code available for payment source | Source: Ambulatory Visit | Attending: Radiation Oncology | Admitting: Radiation Oncology

## 2016-05-11 DIAGNOSIS — R609 Edema, unspecified: Secondary | ICD-10-CM | POA: Diagnosis not present

## 2016-05-11 DIAGNOSIS — C259 Malignant neoplasm of pancreas, unspecified: Secondary | ICD-10-CM | POA: Diagnosis not present

## 2016-05-11 DIAGNOSIS — C787 Secondary malignant neoplasm of liver and intrahepatic bile duct: Secondary | ICD-10-CM | POA: Diagnosis not present

## 2016-05-11 DIAGNOSIS — G4700 Insomnia, unspecified: Secondary | ICD-10-CM | POA: Diagnosis not present

## 2016-05-11 DIAGNOSIS — C250 Malignant neoplasm of head of pancreas: Secondary | ICD-10-CM | POA: Diagnosis not present

## 2016-05-12 ENCOUNTER — Other Ambulatory Visit: Payer: Self-pay | Admitting: *Deleted

## 2016-05-12 NOTE — Patient Outreach (Signed)
[facility]) Care Management  05/12/2016  Christopher Clark [birth year] [id number]   Transition of care   Spoke with [name], reports patient is resting now.  She reports he is feeling a little better, as far a pain . Reports his appetite is a little better, she is preparing meals with protein he is drinking glucerna , recent weight loss of 2 pounds.  She reports patient has a little more energy, denies any episodes of complaint of increased heart rate.    She reports patient blood work numbers where good on yesterday and he was able to begin chemo treatment that will end on tomorrow. Patient has rescheduled his PCP and MRI of Brain appointment for next week.  Plan Will follow up next week with transition of care call and schedule next home visit.   Joylene Draft, RN, [place] Management [phone]- Mobile [phone]- Toll Free Main Office

## 2016-05-13 DIAGNOSIS — Z452 Encounter for adjustment and management of vascular access device: Secondary | ICD-10-CM | POA: Diagnosis not present

## 2016-05-13 DIAGNOSIS — C250 Malignant neoplasm of head of pancreas: Secondary | ICD-10-CM | POA: Diagnosis not present

## 2016-05-13 DIAGNOSIS — D696 Thrombocytopenia, unspecified: Secondary | ICD-10-CM | POA: Diagnosis not present

## 2016-05-13 DIAGNOSIS — C787 Secondary malignant neoplasm of liver and intrahepatic bile duct: Secondary | ICD-10-CM | POA: Diagnosis not present

## 2016-05-16 ENCOUNTER — Emergency Department (HOSPITAL_COMMUNITY)
Admission: EM | Admit: 2016-05-16 | Discharge: 2016-05-16 | Disposition: A | Discharge status: Home / Self Care | Payer: PPO | Source: Home / Self Care | Attending: Emergency Medicine | Admitting: Emergency Medicine

## 2016-05-16 ENCOUNTER — Encounter (HOSPITAL_COMMUNITY): Payer: Self-pay | Admitting: Emergency Medicine

## 2016-05-16 ENCOUNTER — Ambulatory Visit (HOSPITAL_COMMUNITY)
Admission: RE | Admit: 2016-05-16 | Discharge: 2016-05-16 | Disposition: A | Discharge status: Home / Self Care | Payer: PPO | Source: Ambulatory Visit | Attending: Radiation Oncology | Admitting: Radiation Oncology

## 2016-05-16 DIAGNOSIS — C7931 Secondary malignant neoplasm of brain: Secondary | ICD-10-CM | POA: Insufficient documentation

## 2016-05-16 DIAGNOSIS — Z79899 Other long term (current) drug therapy: Secondary | ICD-10-CM | POA: Insufficient documentation

## 2016-05-16 DIAGNOSIS — C259 Malignant neoplasm of pancreas, unspecified: Secondary | ICD-10-CM | POA: Diagnosis not present

## 2016-05-16 DIAGNOSIS — Z7901 Long term (current) use of anticoagulants: Secondary | ICD-10-CM

## 2016-05-16 DIAGNOSIS — E1140 Type 2 diabetes mellitus with diabetic neuropathy, unspecified: Secondary | ICD-10-CM | POA: Insufficient documentation

## 2016-05-16 DIAGNOSIS — Z8507 Personal history of malignant neoplasm of pancreas: Secondary | ICD-10-CM

## 2016-05-16 DIAGNOSIS — Z7982 Long term (current) use of aspirin: Secondary | ICD-10-CM | POA: Insufficient documentation

## 2016-05-16 DIAGNOSIS — I10 Essential (primary) hypertension: Secondary | ICD-10-CM | POA: Insufficient documentation

## 2016-05-16 DIAGNOSIS — C7949 Secondary malignant neoplasm of other parts of nervous system: Secondary | ICD-10-CM

## 2016-05-16 DIAGNOSIS — Z794 Long term (current) use of insulin: Secondary | ICD-10-CM | POA: Insufficient documentation

## 2016-05-16 DIAGNOSIS — M6281 Muscle weakness (generalized): Secondary | ICD-10-CM | POA: Diagnosis not present

## 2016-05-16 DIAGNOSIS — G9389 Other specified disorders of brain: Secondary | ICD-10-CM | POA: Diagnosis not present

## 2016-05-16 DIAGNOSIS — R531 Weakness: Secondary | ICD-10-CM

## 2016-05-16 LAB — BASIC METABOLIC PANEL
Anion gap: 14 (ref 5–15)
BUN: 30 mg/dL — AB (ref 6–20)
CHLORIDE: 94 mmol/L — AB (ref 101–111)
CO2: 25 mmol/L (ref 22–32)
CREATININE: 1.22 mg/dL (ref 0.61–1.24)
Calcium: 9.3 mg/dL (ref 8.9–10.3)
GFR, EST NON AFRICAN AMERICAN: 56 mL/min — AB (ref >60)
Glucose, Bld: 135 mg/dL — ABNORMAL HIGH (ref 65–99)
Potassium: 3.6 mmol/L (ref 3.5–5.1)
SODIUM: 133 mmol/L — AB (ref 135–145)

## 2016-05-16 LAB — URINALYSIS, ROUTINE W REFLEX MICROSCOPIC
Bilirubin Urine: NEGATIVE
GLUCOSE, UA: 250 mg/dL — AB
Hgb urine dipstick: NEGATIVE
KETONES UR: 15 mg/dL — AB
LEUKOCYTES UA: NEGATIVE
NITRITE: NEGATIVE
PROTEIN: NEGATIVE mg/dL
Specific Gravity, Urine: 1.02 (ref 1.005–1.030)
pH: 6.5 (ref 5.0–8.0)

## 2016-05-16 LAB — CBC
HCT: 34 % — ABNORMAL LOW (ref 39.0–52.0)
Hemoglobin: 11.4 g/dL — ABNORMAL LOW (ref 13.0–17.0)
MCH: 29.1 pg (ref 26.0–34.0)
MCHC: 33.5 g/dL (ref 30.0–36.0)
MCV: 86.7 fL (ref 78.0–100.0)
PLATELETS: 131 10*3/uL — AB (ref 150–400)
RBC: 3.92 MIL/uL — AB (ref 4.22–5.81)
RDW: 17.6 % — AB (ref 11.5–15.5)
WBC: 9.2 10*3/uL (ref 4.0–10.5)

## 2016-05-16 LAB — CREATININE, SERUM
CREATININE: 1.33 mg/dL — AB (ref 0.61–1.24)
GFR calc non Af Amer: 51 mL/min — ABNORMAL LOW (ref >60)
GFR, EST AFRICAN AMERICAN: 59 mL/min — AB (ref >60)

## 2016-05-16 LAB — CBG MONITORING, ED: GLUCOSE-CAPILLARY: 127 mg/dL — AB (ref 65–99)

## 2016-05-16 MED ORDER — SODIUM CHLORIDE 0.9 % IV BOLUS (SEPSIS)
1000.0000 mL | Freq: Once | INTRAVENOUS | Status: AC
  Administered 2016-05-16: 1000 mL via INTRAVENOUS

## 2016-05-16 MED ORDER — GADOBENATE DIMEGLUMINE 529 MG/ML IV SOLN
15.0000 mL | Freq: Once | INTRAVENOUS | Status: AC | PRN
  Administered 2016-05-16: 15 mL via INTRAVENOUS

## 2016-05-16 NOTE — ED Triage Notes (Signed)
Pt states he was feeling generalized weakness this morning- had MRI at [facility] today. Afterwards still felt weak all over. Denies slurred speech, weakness on one side or the other. Pt states his appetite has decreased. Pt has pancreatic cancer and is receiving chemo.

## 2016-05-16 NOTE — ED Provider Notes (Signed)
[facility] DEPT MHP Provider Note   CSN: [id number] Arrival date & time: 05/16/16  1040     History   Chief Complaint Chief Complaint  Patient presents with  . Weakness    HPI Christopher Clark is a 76 y.o. male.  HPI  Pt presenting with c/o generalized weakness.  He has a hx of pancreatic cancer- he last received chemotherapy last week.  [facility] and afterwards came to the ED due to ongoing weakness.  Wife states he got IV fluids last week and has hx of getting dehydrated easily.  He has had decreased appetite for the past few days, but no vomiting or diarrhea.  No fever/chills.  No fainting or focal weakness.  There are no other associated systemic symptoms, there are no other alleviating or modifying factors.   Past Medical History:  Diagnosis Date  . Brain tumor (benign) ([place])   . Cancer ([place])    basal cell carcinoma.Pancreas cancer  . Cataracts, bilateral   . Diabetes mellitus without complication ([place])    takes Lantus nightly and Metformin daily.Average fasting blood sugar 120-180  . History of blood transfusion    no abnormal reaction noted  . Hypertension    takes Lisinopril daily  . Hypocholesteremia    takes Lovastatin daily  . Peripheral neuropathy [facility])     Patient Active Problem List   Diagnosis Date Noted  . Brain metastases ([place]) 12/30/2015  . Brain metastasis ([place]) 12/25/2015  . Pancreatic cancer ([place]) 12/25/2015    Past Surgical History:  Procedure Laterality Date  . APPLICATION OF CRANIAL NAVIGATION N/A 12/30/2015   Procedure: APPLICATION OF CRANIAL NAVIGATION;  Surgeon: Kary Kos, MD;  Location: [place] NEURO ORS;  Service: Neurosurgery;  Laterality: N/A;  . CERVICAL FUSION    . CHOLECYSTECTOMY    . COLONOSCOPY    . CRANIOTOMY Left 12/30/2015   Procedure: CRANIOTOMY SUBOCCIPITAL FOR LEFT CEREBELLAR METASTATIC;  Surgeon: Kary Kos, MD;  Location: [place] NEUR[facility] ORS;  Service: Neurosurgery;  Laterality: Left;  CRANIOTOMY  SUBOCCIPITAL FOR LEFT CEREBELLA MET  . EUS N/A 09/11/2013   Procedure: ESOPHAGEAL ENDOSCOPIC ULTRASOUND (EUS) RADIAL;  Surgeon: Arta Silence, MD;  [facility];  Service: Endoscopy;  Laterality: N/A;  . FINE NEEDLE ASPIRATION N/A 09/11/2013   Procedure: FINE NEEDLE ASPIRATION (FNA) RADIAL;  Surgeon: Arta Silence, MD;  [facility];  Service: Endoscopy;  Laterality: N/A;       Home Medications    Prior to Admission medications   Medication Sig Start Date End Date Taking? Authorizing Provider  apixaban (ELIQUIS) 5 MG TABS tablet Take 5 mg by mouth 2 (two) times daily.   Yes Historical Provider, MD  cholecalciferol (VITAMIN D) 1000 UNITS tablet Take 1,000 Units by mouth daily.   Yes Historical Provider, MD  Cyanocobalamin (VITAMIN B 12 PO) Take 1 tablet by mouth daily.   Yes Historical Provider, MD  diltiazem (CARDIZEM CD) 120 MG 24 hr capsule Take 120 mg by mouth daily.   Yes Historical Provider, MD  famotidine (PEPCID) 20 MG tablet Take 1 tablet (20 mg total) by mouth 2 (two) times daily. 01/01/16  Yes Kary Kos, MD  HYDROcodone-acetaminophen (NORCO/VICODIN) 5-325 MG tablet Take 1-2 tablets by mouth every 4 (four) hours as needed for moderate pain. 01/01/16  Yes Kary Kos, MD  insulin glargine (LANTUS) 100 UNIT/ML injection Inject 45 Units into the skin at bedtime.    Yes Historical Provider, MD  lisinopril (PRINIVIL,ZESTRIL) 20 MG  tablet Take 10 mg by mouth every morning.    Yes Historical Provider, MD  LORazepam (ATIVAN) 1 MG tablet Take 1 mg by mouth every 8 (eight) hours as needed for anxiety. Every 8 to 12 hours as needed for insomnia   Yes Historical Provider, MD  lovastatin (MEVACOR) 20 MG tablet Take 20 mg by mouth daily.   Yes Historical Provider, MD  metFORMIN (GLUCOPHAGE) 500 MG tablet Take 500-1,000 mg by mouth 2 (two) times daily with a meal. Takes 1000 mg in the morning and 500 mg in the evening   Yes Historical Provider, MD  oxycodone (OXY-IR) 5 MG capsule  Take 5 mg by mouth every 4 (four) hours as needed.   Yes Historical Provider, MD  traZODone (DESYREL) 50 MG tablet Take 50 mg by mouth at bedtime.   Yes Historical Provider, MD  aspirin 325 MG tablet Take 325 mg by mouth daily.    Historical Provider, MD  diltiazem (CARDIZEM SR) 120 MG 12 hr capsule Take 120 mg by mouth 2 (two) times daily.    Historical Provider, MD  lactulose (CHRONULAC) 10 GM/15ML solution Take 10 g by mouth 3 (three) times daily as needed for mild constipation.    Historical Provider, MD    Family History History reviewed. No pertinent family history.  Social History Social History  Substance Use Topics  . Smoking status: Never Smoker  . Smokeless tobacco: Never Used  . Alcohol use No     Allergies   No known allergies   Review of Systems Review of Systems  ROS reviewed and all otherwise negative except for mentioned in HPI   Physical Exam Updated Vital Signs BP 143/70   Pulse (!) 58   Temp 97.8 F (36.6 C) (Oral)   Resp 13   Ht '5\' 11"'  (1.803 m)   Wt 160 lb (72.6 kg)   SpO2 100%   BMI 22.32 kg/m  Vitals reviewed Physical Exam Physical Examination: General appearance - alert, well appearing, and in no distress Mental status - alert, oriented to person, place, and time Eyes -  Mouth - mucous membranes moist, pharynx normal without lesions Chest - clear to auscultation, no wheezes, rales or rhonchi, symmetric air entry Heart - normal rate, regular rhythm, normal S1, S2, no murmurs, rubs, clicks or gallops Abdomen - soft, nontender, nondistended, no masses or organomegaly Neurological - alert, oriented, normal speech, no focal findings or movement disorder noted Extremities - peripheral pulses normal, no pedal edema, no clubbing or cyanosis Skin - normal coloration and turgor, no rashes, no suspicious skin lesions noted  ED Treatments / Results  Labs (all labs ordered are listed, but only abnormal results are displayed) Labs Reviewed  BASIC  METABOLIC PANEL - Abnormal; Notable for the following:       Result Value   Sodium 133 (*)    Chloride 94 (*)    Glucose, Bld 135 (*)    BUN 30 (*)    GFR calc non Af Amer 56 (*)    All other components within normal limits  CBC - Abnormal; Notable for the following:    RBC 3.92 (*)    Hemoglobin 11.4 (*)    HCT 34.0 (*)    RDW 17.6 (*)    Platelets 131 (*)    All other components within normal limits  URINALYSIS, ROUTINE W REFLEX MICROSCOPIC - Abnormal; Notable for the following:    Glucose, UA 250 (*)    Ketones, ur 15 (*)  All other components within normal limits  CBG MONITORING, ED - Abnormal; Notable for the following:    Glucose-Capillary 127 (*)    All other components within normal limits    EKG  EKG Interpretation  Date/Time:  Monday May 16 2016 10:53:43 EST Ventricular Rate:  80 PR Interval:  146 QRS Duration: 80 QT Interval:  354 QTC Calculation: 408 R Axis:   66 Text Interpretation:  Sinus rhythm with Premature atrial complexes Otherwise normal ECG Since previous tracing pacs are new Confirmed by Canary Brim  MD, MARTHA [phone]) on 05/16/2016 11:57:02 AM       Radiology Mr Brain W Wo Contrast  Result Date: 05/16/2016 CLINICAL DATA:  S RS protocol for previously treated brain metastases. EXAM: MRI HEAD WITHOUT AND WITH CONTRAST TECHNIQUE: Multiplanar, multiecho pulse sequences of the brain and surrounding structures were obtained without and with intravenous contrast. CONTRAST:  52m MULTIHANCE GADOBENATE DIMEGLUMINE 529 MG/ML IV SOLN COMPARISON:  03/28/2016 CT.  MRI 12/31/2015. FINDINGS: Brain: Previous left occipital craniotomy for resection of a metastatic lesion. Mild postoperative scarring in that region shows contraction. No progressive change to suggest residual or recurrent disease. No new lesions seen elsewhere within the brain. Chronic small-vessel ischemic changes affect the deep white matter. No evidence of recent infarction. No hydrocephalus or  extra-axial collection. Vascular: Major vessels at the base of the brain show flow. Skull and upper cervical spine: Negative Sinuses/Orbits: Clear/normal Other: None significant IMPRESSION: No evidence of residual or recurrent disease. Chronic left cerebellar scarring related to previous surgical resection. Electronically Signed   By: MNelson ChimesM.D.   On: 05/16/2016 11:27    Procedures Procedures (including critical care time)  Medications Ordered in ED Medications  sodium chloride 0.9 % bolus 1,000 mL (0 mLs Intravenous Stopped 05/16/16 1355)     Initial Impression / Assessment and Plan / ED Course  I have reviewed the triage vital signs and the nursing notes.  Pertinent labs & imaging results that were available during my care of the patient were reviewed by me and considered in my medical decision making (see chart for details).     Pt presenting with c/o generalized weakness after MRI this morning.  He has had poor appetite, symptoms are consistent with his prior episodes of dehydration associated with his illness and chemotherapy.  Pt feels improved after IV fluids.  Labs are near his baseline labs.  Discharged with strict return precautions.  Pt agreeable with plan.  Final Clinical Impressions(s) / ED Diagnoses   Final diagnoses:  Generalized weakness    New Prescriptions Discharge Medication List as of 05/16/2016  2:17 PM       MAlfonzo Beers MD 05/17/16 [id number]

## 2016-05-16 NOTE — Discharge Instructions (Signed)
Return to the ED with any concerns including vomiting and not able to keep down liquids, fainting, fever/chills, difficulty breathing, decreased level of alertness/lethargy, or any other alarming symptoms

## 2016-05-19 ENCOUNTER — Ambulatory Visit: Payer: Self-pay | Admitting: *Deleted

## 2016-05-20 ENCOUNTER — Other Ambulatory Visit: Payer: Self-pay | Admitting: *Deleted

## 2016-05-20 NOTE — Patient Outreach (Signed)
[facility]) Care Management  05/20/2016  Christopher Clark 28-Apr-1940 [id number]   Transition of care call   Placed call to patient , reports he is doing okay, he discussed his recent MRI ,weakness and visit to [facility] ED. Patient describes feeling better on today and going out for lunch. Patient reports some improvement with appetite since being on prednisone and blood sugars running about the same 100's to 200's.  Patient reports he still has some trouble with sleeping at night but takes medication that helps some at times Patient has follow up appointment with PCP, Dr.Penner on Monday , 2/12.  Plan Will place transition of care call on next week and schedule home visit .  Joylene Draft, RN, [place] Management [phone]- Mobile [phone]- Toll Free Main Office

## 2016-05-23 ENCOUNTER — Encounter: Payer: Self-pay | Admitting: Radiation Oncology

## 2016-05-23 ENCOUNTER — Ambulatory Visit
Admission: RE | Admit: 2016-05-23 | Discharge: 2016-05-23 | Disposition: A | Discharge status: Home / Self Care | Payer: PPO | Source: Ambulatory Visit | Attending: Radiation Oncology | Admitting: Radiation Oncology

## 2016-05-23 VITALS — BP 107/64 | HR 70 | Temp 98.2°F | Resp 18 | Ht 71.0 in | Wt 159.0 lb

## 2016-05-23 DIAGNOSIS — Z8507 Personal history of malignant neoplasm of pancreas: Secondary | ICD-10-CM | POA: Insufficient documentation

## 2016-05-23 DIAGNOSIS — Z7901 Long term (current) use of anticoagulants: Secondary | ICD-10-CM | POA: Insufficient documentation

## 2016-05-23 DIAGNOSIS — Z7982 Long term (current) use of aspirin: Secondary | ICD-10-CM | POA: Diagnosis not present

## 2016-05-23 DIAGNOSIS — Z08 Encounter for follow-up examination after completed treatment for malignant neoplasm: Secondary | ICD-10-CM | POA: Diagnosis not present

## 2016-05-23 DIAGNOSIS — E7800 Pure hypercholesterolemia, unspecified: Secondary | ICD-10-CM | POA: Insufficient documentation

## 2016-05-23 DIAGNOSIS — Z9221 Personal history of antineoplastic chemotherapy: Secondary | ICD-10-CM | POA: Diagnosis not present

## 2016-05-23 DIAGNOSIS — Z79899 Other long term (current) drug therapy: Secondary | ICD-10-CM | POA: Insufficient documentation

## 2016-05-23 DIAGNOSIS — I10 Essential (primary) hypertension: Secondary | ICD-10-CM | POA: Insufficient documentation

## 2016-05-23 DIAGNOSIS — C7931 Secondary malignant neoplasm of brain: Secondary | ICD-10-CM | POA: Diagnosis not present

## 2016-05-23 DIAGNOSIS — F5101 Primary insomnia: Secondary | ICD-10-CM | POA: Diagnosis not present

## 2016-05-23 DIAGNOSIS — Z85828 Personal history of other malignant neoplasm of skin: Secondary | ICD-10-CM | POA: Insufficient documentation

## 2016-05-23 DIAGNOSIS — Z923 Personal history of irradiation: Secondary | ICD-10-CM | POA: Diagnosis not present

## 2016-05-23 DIAGNOSIS — Z794 Long term (current) use of insulin: Secondary | ICD-10-CM | POA: Insufficient documentation

## 2016-05-23 DIAGNOSIS — E1142 Type 2 diabetes mellitus with diabetic polyneuropathy: Secondary | ICD-10-CM | POA: Diagnosis not present

## 2016-05-23 DIAGNOSIS — K5901 Slow transit constipation: Secondary | ICD-10-CM | POA: Diagnosis not present

## 2016-05-23 DIAGNOSIS — L570 Actinic keratosis: Secondary | ICD-10-CM | POA: Diagnosis not present

## 2016-05-23 DIAGNOSIS — Z0000 Encounter for general adult medical examination without abnormal findings: Secondary | ICD-10-CM | POA: Diagnosis not present

## 2016-05-23 DIAGNOSIS — E1140 Type 2 diabetes mellitus with diabetic neuropathy, unspecified: Secondary | ICD-10-CM | POA: Diagnosis not present

## 2016-05-24 ENCOUNTER — Other Ambulatory Visit: Payer: Self-pay | Admitting: Radiation Oncology

## 2016-05-24 DIAGNOSIS — C7931 Secondary malignant neoplasm of brain: Secondary | ICD-10-CM

## 2016-05-24 DIAGNOSIS — C259 Malignant neoplasm of pancreas, unspecified: Secondary | ICD-10-CM

## 2016-05-24 MED ORDER — LORAZEPAM 1 MG PO TABS: ORAL_TABLET | ORAL | 0 refills | Status: AC

## 2016-05-24 NOTE — Progress Notes (Signed)
Radiation Oncology         [phone] ________________________________  Name: Christopher Clark MRN: [id number]  Date: 05/23/2016  DOB: 1940/11/28  Follow-Up Visit Note  CC: Greig Right, MD  Greig Right, MD  Diagnosis:   76 y.o. 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    ICD-9-CM ICD-10-CM   1. Brain metastases (HCC) 198.3 C79.31     Interval Since Last Radiation: 5 months  12/29/15 Preoperative SRS Treatment: PTV1: Left cerebellar 20 mmtarget was treated using 4 Dynamic Conformal Arcsto a prescription dose of 16Gy. ExacTrac registration was performed for each couch angle. The 81.3% isodose line was prescribed. 6 MV X-rays were delivered in the flattening filter free beam mode.   Narrative:   Christopher Clark is a very pleasant 76 y.o. woman with a history of adenocarcinoma of the pancreas originally diagnosed in 45. He has undergone chemotherapy regimens, and when he developed disease in the brain in the summer of 2017, underwent preoperative SRS, followed by craniotomy with Dr. Saintclair Halsted. He has done well systemically and in the CNS since completing treatment. His last MRI scan on 05/16/16  revealed  no new disease, and stable postsurgical change within the resection cavity without enhancement. He comes today to review this. He continues to do well systemically and is followed in-for a with medical oncology. He is off any systemic therapy at this time.  On review of systems, the patient reports that he is doing well overall. He denies any chest pain, shortness of breath, cough, fevers, chills, night sweats, unintended weight changes. He denies any bowel or bladder disturbances, and denies abdominal pain, nausea or vomiting. He denies any new musculoskeletal or joint aches or pains, new skin lesions or concerns. He denies headaches, changes in visual or auditory acuity, nausea or vomiting. He denies any imbalance or gait  disturbance. A complete review of systems is obtained and is otherwise negative.                       Past Medical History:  Past Medical History:  Diagnosis Date  . Brain tumor (benign) ([place])   . Cancer ([place])    basal cell carcinoma.Pancreas cancer  . Cataracts, bilateral   . Diabetes mellitus without complication ([place])    takes Lantus nightly and Metformin daily.Average fasting blood sugar 120-180  . History of blood transfusion    no abnormal reaction noted  . Hypertension    takes Lisinopril daily  . Hypocholesteremia    takes Lovastatin daily  . Peripheral neuropathy [facility])     Past Surgical History: Past Surgical History:  Procedure Laterality Date  . APPLICATION OF CRANIAL NAVIGATION N/A 12/30/2015   Procedure: APPLICATION OF CRANIAL NAVIGATION;  Surgeon: Kary Kos, MD;  Location: [place] NEURO ORS;  Service: Neurosurgery;  Laterality: N/A;  . CERVICAL FUSION    . CHOLECYSTECTOMY    . COLONOSCOPY    . CRANIOTOMY Left 12/30/2015   Procedure: CRANIOTOMY SUBOCCIPITAL FOR LEFT CEREBELLAR METASTATIC;  Surgeon: Kary Kos, MD;  Location: [place] NEURO ORS;  Service: Neurosurgery;  Laterality: Left;  CRANIOTOMY SUBOCCIPITAL FOR LEFT CEREBELLA MET  . EUS N/A 09/11/2013   Procedure: ESOPHAGEAL ENDOSCOPIC ULTRASOUND (EUS) RADIAL;  Surgeon: Arta Silence, MD;  [facility];  Service: Endoscopy;  Laterality: N/A;  . FINE NEEDLE ASPIRATION N/A 09/11/2013   Procedure: FINE NEEDLE ASPIRATION (FNA) RADIAL;  Surgeon: Arta Silence, MD;  [facility];  Service: Endoscopy;  Laterality: N/A;    Social History:  Social History   Social History  . Marital status: Married    Spouse name: N/A  . Number of children: N/A  . Years of education: N/A   Occupational History  . Not on file.   Social History Main Topics  . Smoking status: Never Smoker  . Smokeless tobacco: Never Used  . Alcohol use No  . Drug use: No  . Sexual activity: Not on file   Other Topics Concern  . Not  on file   Social History Narrative  . No narrative on file    Family History:No family history on file.  ALLERGIES:  is allergic to no known allergies.  Meds: Current Outpatient Prescriptions  Medication Sig Dispense Refill  . apixaban (ELIQUIS) 5 MG TABS tablet Take 5 mg by mouth 2 (two) times daily.    . cholecalciferol (VITAMIN D) 1000 UNITS tablet Take 1,000 Units by mouth daily.    . Cyanocobalamin (VITAMIN B 12 PO) Take 1 tablet by mouth daily.    Marland Kitchen diltiazem (CARDIZEM SR) 120 MG 12 hr capsule Take 120 mg by mouth 2 (two) times daily.    . famotidine (PEPCID) 20 MG tablet Take 1 tablet (20 mg total) by mouth 2 (two) times daily. 60 tablet 0  . HYDROcodone-acetaminophen (NORCO/VICODIN) 5-325 MG tablet Take 1-2 tablets by mouth every 4 (four) hours as needed for moderate pain. 30 tablet 0  . insulin glargine (LANTUS) 100 UNIT/ML injection Inject 45 Units into the skin at bedtime.     Marland Kitchen lactulose (CHRONULAC) 10 GM/15ML solution Take 10 g by mouth 3 (three) times daily as needed for mild constipation.    Marland Kitchen lisinopril (PRINIVIL,ZESTRIL) 20 MG tablet Take 10 mg by mouth every morning.     Marland Kitchen LORazepam (ATIVAN) 1 MG tablet Take 1 mg by mouth every 8 (eight) hours as needed for anxiety. Every 8 to 12 hours as needed for insomnia    . lovastatin (MEVACOR) 20 MG tablet Take 20 mg by mouth daily.    . metFORMIN (GLUCOPHAGE) 500 MG tablet Take 500-1,000 mg by mouth 2 (two) times daily with a meal. Takes 1000 mg in the morning and 500 mg in the evening    . oxycodone (OXY-IR) 5 MG capsule Take 5 mg by mouth every 4 (four) hours as needed.    . traZODone (DESYREL) 50 MG tablet Take 50 mg by mouth at bedtime.    Marland Kitchen aspirin 325 MG tablet Take 325 mg by mouth daily.    Marland Kitchen diltiazem (CARDIZEM CD) 120 MG 24 hr capsule Take 120 mg by mouth daily.     No current facility-administered medications for this encounter.     Physical Findings:  height is '5\' 11"'  (1.803 m) and weight is 159 lb (72.1 kg). His  oral temperature is 98.2 F (36.8 C). His blood pressure is 107/64 and his pulse is 70. His respiration is 18 and oxygen saturation is 100%.   In general this is a well appearing Caucasian male in no acute distress. He's alert and oriented x4 and appropriate throughout the examination. Cardiopulmonary assessment is negative for acute distress and he exhibits normal effort.    Lab Findings: Lab Results  Component Value Date   WBC 9.2 05/16/2016   HGB 11.4 (L) 05/16/2016   HCT 34.0 (L) 05/16/2016   PLT 131 (L) 05/16/2016    Lab Results  Component Value Date   NA 133 (L) 05/16/2016   NA  130 (L) 12/25/2015   K 3.6 05/16/2016   K 5.2 (H) 12/25/2015   CHLORIDE 95 (L) 12/25/2015   CO2 25 05/16/2016   CO2 19 (L) 12/25/2015   GLUCOSE 135 (H) 05/16/2016   GLUCOSE 336 (H) 12/25/2015   BUN 30 (H) 05/16/2016   BUN 49.5 (H) 12/25/2015   CREATININE 1.22 05/16/2016   CREATININE 1.8 (H) 12/25/2015   BILITOT 0.86 12/25/2015   ALKPHOS 63 12/25/2015   AST 15 12/25/2015   ALT 31 12/25/2015   PROT 7.3 12/25/2015   ALBUMIN 3.8 12/25/2015   CALCIUM 9.3 05/16/2016   CALCIUM 9.6 12/25/2015   ANIONGAP 14 05/16/2016    Radiographic Findings: Mr Jeri Cos LT Contrast  Result Date: 05/16/2016 CLINICAL DATA:  S RS protocol for previously treated brain metastases. EXAM: MRI HEAD WITHOUT AND WITH CONTRAST TECHNIQUE: Multiplanar, multiecho pulse sequences of the brain and surrounding structures were obtained without and with intravenous contrast. CONTRAST:  57m MULTIHANCE GADOBENATE DIMEGLUMINE 529 MG/ML IV SOLN COMPARISON:  03/28/2016 CT.  MRI 12/31/2015. FINDINGS: Brain: Previous left occipital craniotomy for resection of a metastatic lesion. Mild postoperative scarring in that region shows contraction. No progressive change to suggest residual or recurrent disease. No new lesions seen elsewhere within the brain. Chronic small-vessel ischemic changes affect the deep white matter. No evidence of recent  infarction. No hydrocephalus or extra-axial collection. Vascular: Major vessels at the base of the brain show flow. Skull and upper cervical spine: Negative Sinuses/Orbits: Clear/normal Other: None significant IMPRESSION: No evidence of residual or recurrent disease. Chronic left cerebellar scarring related to previous surgical resection. Electronically Signed   By: MNelson ChimesM.D.   On: 05/16/2016 11:27    Impression/Plan: 1. 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 The patient's MRI was reviewed today. It appears he is NED in the CNS at this time. We will plan to reimage with an MRI in 3 month's time. He states agreement and understanding. [place] and notify [place] questions or concerns that arise prior to our next visit.      ACarola Rhine PAC

## 2016-05-24 NOTE — Progress Notes (Signed)
Pt requested Ativan refill for claustrophobia during MRI studies. New Rx called to his pharmacy.

## 2016-05-26 ENCOUNTER — Other Ambulatory Visit: Payer: Self-pay | Admitting: *Deleted

## 2016-05-26 DIAGNOSIS — E119 Type 2 diabetes mellitus without complications: Secondary | ICD-10-CM | POA: Insufficient documentation

## 2016-05-26 DIAGNOSIS — E118 Type 2 diabetes mellitus with unspecified complications: Secondary | ICD-10-CM

## 2016-05-26 NOTE — Patient Outreach (Addendum)
[facility]) Care Management  05/26/2016  NOAAH SCHUELE [birth year] [id number]   Transition of care call  Spoke with patient discussed his recent visit to PCP this week and some changes in medication he declined to review changes at this time. Reports he is taking medications as prescribed.  Patient very brief on the phone , wishes to discuss other concerns with me at home visit. Denies having urgent needs.  Patient has completed first 31 days of transition of care, will continue to follow for complex case management .    Plan Will plan home visit in the next week.  Joylene Draft, RN, [place] Management [phone]- Mobile [phone]- Toll Free Main Office

## 2016-05-30 DIAGNOSIS — C250 Malignant neoplasm of head of pancreas: Secondary | ICD-10-CM | POA: Diagnosis not present

## 2016-05-30 DIAGNOSIS — C7989 Secondary malignant neoplasm of other specified sites: Secondary | ICD-10-CM | POA: Diagnosis not present

## 2016-05-30 DIAGNOSIS — M7989 Other specified soft tissue disorders: Secondary | ICD-10-CM | POA: Diagnosis not present

## 2016-06-02 ENCOUNTER — Other Ambulatory Visit: Payer: Self-pay | Admitting: *Deleted

## 2016-06-02 ENCOUNTER — Ambulatory Visit: Payer: Self-pay | Admitting: *Deleted

## 2016-06-02 NOTE — Patient Outreach (Signed)
[place]) Care Management  06/02/2016  ARAMIS CRITCHLOW 23-Oct-1940 [id number]   Incoming telephone call from [name], she reports patient is not feeling well on today and that they need to cancel home visit , she is concerned that he may be dehydrated, she discussed he had a recent chemo treatment on Monday. Mrs.Nowling discussed patient's blood sugar was 78 this morning she gave him a snack and he felt better,reports they have not rechecked it yet but they will. They have not checked blood pressure yet encouraged to.   Mrs. Slocumb discussed patient has appointment at cancer this afternoon for shot related to decreased WBC  and hopes they will check his blood and if not she will take him to emergency room . Discussed calling PCP for same day visit, she declined offer to assist, declined acute home visit.    Plan Reviewed low blood treatment action plan Will follow up with patient in the next business day to reschedule home visit.   Joylene Draft, RN, [place] Management [phone]- Mobile [phone]- Toll Free Main Office

## 2016-06-03 ENCOUNTER — Other Ambulatory Visit: Payer: Self-pay | Admitting: *Deleted

## 2016-06-03 NOTE — Patient Outreach (Signed)
[facility]) Care Management  06/03/2016  Christopher Clark 01-30-1941 [id number]  Follow up telephone call  Placed call to patient able to speak with his wife [name] that he has given verbal consent to speak with on his behalf. Mrs.Grigg reports patient is feeling better on today, while the cancer center appointment he was able to receive a fluid transfusion that helped him to feel stronger on today. Patient's blood sugar was 101 today denies any hypoglycemia symptoms, patient continues to tolerate foods in small amounts, some days are better than other with intake, his wife reports she tries to fix foods and make sure he has a balanced diet with protein. She reports patient's blood sugar runs a little higher in the evenings 150 200's.  Patient continues to monitor blood sugar and blood pressure daily, today BP if 123/68, heart rate 70 to 80 with any episodes of increased heart rates.  Plan Will reschedule home visit in the next 2 weeks.  Patient will continue to monitor blood sugars and treat hypoglycemia episodes per protocol and notify MD of increased episodes.   Joylene Draft, RN, [place] Management [phone]- Mobile [phone]- Toll Free Main Office

## 2016-06-09 DIAGNOSIS — I700 Atherosclerosis of aorta: Secondary | ICD-10-CM | POA: Diagnosis not present

## 2016-06-09 DIAGNOSIS — I4891 Unspecified atrial fibrillation: Secondary | ICD-10-CM | POA: Diagnosis not present

## 2016-06-09 DIAGNOSIS — I10 Essential (primary) hypertension: Secondary | ICD-10-CM | POA: Diagnosis not present

## 2016-06-09 DIAGNOSIS — I484 Atypical atrial flutter: Secondary | ICD-10-CM | POA: Diagnosis not present

## 2016-06-09 DIAGNOSIS — Z7901 Long term (current) use of anticoagulants: Secondary | ICD-10-CM | POA: Diagnosis not present

## 2016-06-09 DIAGNOSIS — I4892 Unspecified atrial flutter: Secondary | ICD-10-CM | POA: Diagnosis not present

## 2016-06-09 DIAGNOSIS — R531 Weakness: Secondary | ICD-10-CM | POA: Diagnosis not present

## 2016-06-09 DIAGNOSIS — K219 Gastro-esophageal reflux disease without esophagitis: Secondary | ICD-10-CM | POA: Diagnosis not present

## 2016-06-09 DIAGNOSIS — Z79899 Other long term (current) drug therapy: Secondary | ICD-10-CM | POA: Diagnosis not present

## 2016-06-09 DIAGNOSIS — Z794 Long term (current) use of insulin: Secondary | ICD-10-CM | POA: Diagnosis not present

## 2016-06-10 DIAGNOSIS — R531 Weakness: Secondary | ICD-10-CM | POA: Diagnosis not present

## 2016-06-10 DIAGNOSIS — I484 Atypical atrial flutter: Secondary | ICD-10-CM | POA: Diagnosis not present

## 2016-06-13 ENCOUNTER — Other Ambulatory Visit: Payer: Self-pay | Admitting: *Deleted

## 2016-06-13 ENCOUNTER — Ambulatory Visit: Payer: Self-pay | Admitting: *Deleted

## 2016-06-13 NOTE — Patient Outreach (Signed)
[facility] [facility]) Care Management  06/13/2016  Christopher Clark 1940-11-28 [id number]   Incoming call from Christopher Clark to request to change scheduled home visit from today. Patient discussed his recent inpatient hospital admission on 3/1 due to increased weakness, dizziness,transported to [facility] by ambulance. Patient discussed his heart rate was up and rhythm in atrial fibrillation.   Patient discussed he is feeling some better on today , denies dizziness or fast heart rate.  States he is taking medications as instructed, unable to review at this time. He is attending medical visit with his wife today and requested we talk further at home visit on this week.  Plan Will follow patient for transition of care and plan home visit for this week.   Joylene Draft, RN, [place] Management [phone]- Mobile [phone]- Toll Free Main Office

## 2016-06-16 ENCOUNTER — Other Ambulatory Visit: Payer: Self-pay | Admitting: *Deleted

## 2016-06-16 ENCOUNTER — Encounter: Payer: Self-pay | Admitting: *Deleted

## 2016-06-16 DIAGNOSIS — F5101 Primary insomnia: Secondary | ICD-10-CM | POA: Diagnosis not present

## 2016-06-16 DIAGNOSIS — R609 Edema, unspecified: Secondary | ICD-10-CM | POA: Diagnosis not present

## 2016-06-16 DIAGNOSIS — F320 Major depressive disorder, single episode, mild: Secondary | ICD-10-CM | POA: Diagnosis not present

## 2016-06-16 NOTE — Patient Outreach (Signed)
Christopher Clark/[facility]) Care Management   06/16/2016  Christopher Clark 03-Nov-1940 [id number]  Christopher BREDEN is an 76 y.o. male  Subjective:  Patient discussed his visit to PCP on today, his problem with not sleeping well at night, and new prescription ordered today. Patient discussed swelling that he has in lower legs feet/ankles. Patient states that he does weigh a few times a week at home at times.   Patient discussed his blood sugar is a little more elevated later in the day compared to morning.   Patient discussed he will have another chemo therapy treatment on Monday and then scan in a month or so after that to follow up on liver and pancreas. Patient questions if [facility] will [facility] cost of MRI as if covers chemotherapy treatment .    Objective:  Vitals:   06/16/16 1454  BP: 118/70  Pulse: 78  Resp: 18   Review of Systems  Constitutional: Negative.   HENT: Negative.   Eyes: Negative.   Respiratory: Negative.   Cardiovascular: Positive for leg swelling.       Bilateral lower extremity edema especially feet and ankles   Gastrointestinal: Negative.   Genitourinary: Negative.   Musculoskeletal: Negative.   Skin: Negative.   Neurological:       Al little dizzy when first standing   Endo/Heme/Allergies: Negative.   Psychiatric/Behavioral:       Difficulty sleeping at night    Physical Exam  Constitutional: He is oriented to person, place, and time. He appears well-developed and well-nourished.  Cardiovascular: Normal rate and intact distal pulses.   Respiratory: Effort normal and breath sounds normal.  GI: Soft. Bowel sounds are normal.  Neurological: He is alert and oriented to person, place, and time.  Skin: Skin is warm and dry.  Portacath  Psychiatric: He has a normal mood and affect. His behavior is normal. Judgment and thought content normal.    Encounter Medications:   Outpatient Encounter Prescriptions as of 06/16/2016  Medication Sig Note  . apixaban  (ELIQUIS) 5 MG TABS tablet Take 5 mg by mouth 2 (two) times daily.   Marland Kitchen diltiazem (CARDIZEM CD) 120 MG 24 hr capsule Take 120 mg by mouth daily.   . famotidine (PEPCID) 20 MG tablet Take 1 tablet (20 mg total) by mouth 2 (two) times daily.   Marland Kitchen HYDROcodone-acetaminophen (NORCO/VICODIN) 5-325 MG tablet Take 1-2 tablets by mouth every 4 (four) hours as needed for moderate pain.   Marland Kitchen insulin glargine (LANTUS) 100 UNIT/ML injection Inject 35 Units into the skin at bedtime.    Marland Kitchen lactulose (CHRONULAC) 10 GM/15ML solution Take 10 g by mouth 3 (three) times daily as needed for mild constipation.   Marland Kitchen lisinopril (PRINIVIL,ZESTRIL) 20 MG tablet Take 10 mg by mouth every morning.    . lovastatin (MEVACOR) 20 MG tablet Take 20 mg by mouth daily.   . metFORMIN (GLUCOPHAGE) 500 MG tablet Take 500-1,000 mg by mouth 2 (two) times daily with a meal. Takes 1000 mg in the morning and 500 mg in the evening   . traZODone (DESYREL) 50 MG tablet Take 50 mg by mouth at bedtime.   Marland Kitchen aspirin 325 MG tablet Take 325 mg by mouth daily. 04/18/2016: Not taking  . cholecalciferol (VITAMIN D) 1000 UNITS tablet Take 1,000 Units by mouth daily.   . Cyanocobalamin (VITAMIN B 12 PO) Take 1 tablet by mouth daily.   Marland Kitchen diltiazem (CARDIZEM SR) 120 MG 12 hr capsule Take 120 mg by mouth 2 (  two) times daily. 01/29/2016: Not taking  . LORazepam (ATIVAN) 1 MG tablet One po QHS prn for insomnia, and 30 minutes prior to MRI (Patient not taking: Reported on 06/16/2016)   . oxycodone (OXY-IR) 5 MG capsule Take 5 mg by mouth every 4 (four) hours as needed. 06/16/2016: Not taking   No facility-administered encounter medications on file as of 06/16/2016.     Functional Status:   In your present state of health, do you have any difficulty performing the following activities: 06/16/2016 04/18/2016  Hearing? N N  Vision? N N  Difficulty concentrating or making decisions? N N  Walking or climbing stairs? Y Y  Dressing or bathing? N N  Doing errands, shopping? [place]  Preparing Food and eating ? N N  Using the Toilet? N N  In the past six months, have you accidently leaked urine? N N  Do you have problems with loss of bowel control? N N  Managing your Medications? Y N  Managing your Finances? N N  Housekeeping or managing your Housekeeping? N N  Some recent data might be hidden    Fall/Depression Screening:    PHQ 2/9 Scores 05/23/2016 04/18/2016 02/16/2016 02/08/2016 01/18/2016 12/25/2015  PHQ - 2 Score 0 1 0 0 3 0  PHQ- 9 Score - - - 1 13 -    Assessment:    Atrial Fib/flutter-  Rate controlled on today, taking medications as prescribed monitors blood pressure and heart rate at home. Benefit from review of symptoms of atrial fib and action plan follow up.  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   Plan:  Patient centered care goals developed at visit .  Patient will notify MD of worsening of symptoms of increased heart rate, dizziness , weakness shortness of breath  Will follow up with patient in the next week by telephone for weekly transition of care call Will assist patient with following up regarding if [facility] will cover cost of MRI for cancer follow up at [facility] as it covers chemotherapy at cancer center in [place].    THN CM Care Plan Problem One   Flowsheet Row Most Recent Value  Care Plan Problem One  High risk for readmission related to recent hospital admission   Role Documenting the Problem One  Care Management [place] for Problem One  Active  THN Long Term Goal (31-90 days)  Patient will not experience a hosptial readmission in the next 60  days   THN Long Term Goal Start Date  06/13/16  Interventions for Problem One Long Term Goal  Discussed knowing where to go  for medical concerns, offered to call PCP for same day visit .   THN CM Short Term Goal #1 (0-30 days)  Patient will be able to recognize  of atrial fibrillation to notify MD of in the next 30 days   THN CM Short Term Goal #1 Start Date  06/13/16 [goal restarted ]  Interventions for Short Term Goal #1  Reviewed symptoms of atrial fib from [facility] book to notify MD of   THN CM Short Term Goal #2 (0-30 days)  Patient will report continuing to monitor and record  blood pressure and heart rate reading least twice weekly in the next  30 days   THN CM Short Term Goal #2 Start Date  06/13/16  Interventions for Short Term Goal #2  Reinforced to continue to monitor readings and notify of MD reading discussed normal ranges.    THN CM Short Term Goal #3 (0-30 days)  Patient will report drinking at least 1 boost/ensure daily to supplement diet in the next 30 days   THN CM Short Term Goal #3 Start Date  06/16/16  Interventions for Short Tern Goal #3  Discussed the benefits of balanced diet , adequate protein and benefits   THN CM Short Term Goal #4 (0-30 days)  Patient will report trying to wear support sock in the next 30 days   THN CM Short Term Goal #4 Start Date  06/16/16  Interventions for Short Term Goal #4  Discussed benefit of wearing support hose to help control edema      Joylene Draft, RN, [place] Management [phone]- Mobile [phone]- [place] Office

## 2016-06-20 DIAGNOSIS — R531 Weakness: Secondary | ICD-10-CM | POA: Diagnosis not present

## 2016-06-20 DIAGNOSIS — R109 Unspecified abdominal pain: Secondary | ICD-10-CM | POA: Diagnosis not present

## 2016-06-20 DIAGNOSIS — C250 Malignant neoplasm of head of pancreas: Secondary | ICD-10-CM | POA: Diagnosis not present

## 2016-06-20 DIAGNOSIS — C7989 Secondary malignant neoplasm of other specified sites: Secondary | ICD-10-CM | POA: Diagnosis not present

## 2016-06-20 DIAGNOSIS — G893 Neoplasm related pain (acute) (chronic): Secondary | ICD-10-CM | POA: Diagnosis not present

## 2016-06-20 DIAGNOSIS — F329 Major depressive disorder, single episode, unspecified: Secondary | ICD-10-CM | POA: Diagnosis not present

## 2016-06-21 ENCOUNTER — Telehealth: Payer: Self-pay | Admitting: *Deleted

## 2016-06-21 NOTE — Telephone Encounter (Signed)
"  [place] Case Manager trying to help this patient.  He is a English as a second language teacher.  Has an MRI and F/U wanting to make sure you all are filing everything under [place]."  Provided patient billing customer service phone number.

## 2016-06-22 DIAGNOSIS — C259 Malignant neoplasm of pancreas, unspecified: Secondary | ICD-10-CM | POA: Diagnosis not present

## 2016-06-22 DIAGNOSIS — C250 Malignant neoplasm of head of pancreas: Secondary | ICD-10-CM | POA: Diagnosis not present

## 2016-06-22 DIAGNOSIS — R1084 Generalized abdominal pain: Secondary | ICD-10-CM | POA: Diagnosis not present

## 2016-06-22 DIAGNOSIS — C787 Secondary malignant neoplasm of liver and intrahepatic bile duct: Secondary | ICD-10-CM | POA: Diagnosis not present

## 2016-06-23 ENCOUNTER — Other Ambulatory Visit: Payer: Self-pay | Admitting: *Deleted

## 2016-06-23 NOTE — Patient Outreach (Signed)
[place] [facility]) Care Management  06/23/2016  Christopher Clark April 09, 1941 [id number]   Transition of care call  Placed call to patient able to speak with his wife, [name]  reports patient is taking a nap at this time.  Mrs.Pimenta discussed patient has been feeling weak on today, reports blood pressure readings today has been in good range. She discussed patient felt nauseated and vomited once and hopes he will feel better after that,they have contact Dr.Lewis and reports he will call in prescription for nausea. She discussed patient was not able to have his treatment on Monday also because he wasn't feel good, no sure if it just a part of cancer diagnosis, so they repeated the scans and states no changes in pancreas and liver areas.   Plan Will follow up with patient in the next week as part of transition of care follow up  Discussed with wife to notify MD if patient has repeated episodes of vomiting, and not able to tolerate liquids and seek emergency help for worsening symptoms.     Joylene Draft, RN, [place] Management [phone]- Mobile [phone]- Toll Free Main Office

## 2016-06-30 ENCOUNTER — Other Ambulatory Visit: Payer: Self-pay | Admitting: *Deleted

## 2016-06-30 NOTE — Patient Outreach (Signed)
[place] [facility]) Care Management  06/30/2016  Christopher Clark 06-28-1940 [id number]   Transition of care call   Spoke with Christopher Clark, wife she reports patient is resting at this time. She discussed patient was able to have his treatment on this week.   Patient has been nauseated on today and vomited once. She discussed that they did not get prescription for suppository filled due to cost of $60. Discussed with Christopher Clark that Dr.Lewis  may be able to order a different medication Reinforced the importance of hydration.  Christopher Clark discussed patient has been able to tolerate keeping  his medications on today.   Plan Patient's wife states she will  notify MD of unresolved  nausea and request different medication,after my offering to assist encouraged to call me if assistance needed. Will follow up with patient within the next week as part of transition of care .   Joylene Draft, RN, [place] Management [phone]- Mobile [phone]- Toll Free Main Office

## 2016-07-04 DIAGNOSIS — E876 Hypokalemia: Secondary | ICD-10-CM | POA: Diagnosis not present

## 2016-07-04 DIAGNOSIS — D649 Anemia, unspecified: Secondary | ICD-10-CM | POA: Diagnosis not present

## 2016-07-04 DIAGNOSIS — E785 Hyperlipidemia, unspecified: Secondary | ICD-10-CM | POA: Diagnosis not present

## 2016-07-04 DIAGNOSIS — I4891 Unspecified atrial fibrillation: Secondary | ICD-10-CM | POA: Diagnosis not present

## 2016-07-04 DIAGNOSIS — C259 Malignant neoplasm of pancreas, unspecified: Secondary | ICD-10-CM | POA: Diagnosis not present

## 2016-07-04 DIAGNOSIS — R197 Diarrhea, unspecified: Secondary | ICD-10-CM | POA: Diagnosis not present

## 2016-07-04 DIAGNOSIS — Z859 Personal history of malignant neoplasm, unspecified: Secondary | ICD-10-CM | POA: Diagnosis not present

## 2016-07-04 DIAGNOSIS — I10 Essential (primary) hypertension: Secondary | ICD-10-CM | POA: Diagnosis not present

## 2016-07-04 DIAGNOSIS — Z79899 Other long term (current) drug therapy: Secondary | ICD-10-CM | POA: Diagnosis not present

## 2016-07-04 DIAGNOSIS — K449 Diaphragmatic hernia without obstruction or gangrene: Secondary | ICD-10-CM | POA: Diagnosis not present

## 2016-07-04 DIAGNOSIS — R112 Nausea with vomiting, unspecified: Secondary | ICD-10-CM | POA: Diagnosis not present

## 2016-07-04 DIAGNOSIS — E119 Type 2 diabetes mellitus without complications: Secondary | ICD-10-CM | POA: Diagnosis not present

## 2016-07-04 DIAGNOSIS — K529 Noninfective gastroenteritis and colitis, unspecified: Secondary | ICD-10-CM | POA: Diagnosis not present

## 2016-07-04 DIAGNOSIS — G9340 Encephalopathy, unspecified: Secondary | ICD-10-CM | POA: Diagnosis not present

## 2016-07-04 DIAGNOSIS — Z85841 Personal history of malignant neoplasm of brain: Secondary | ICD-10-CM | POA: Diagnosis not present

## 2016-07-04 DIAGNOSIS — K219 Gastro-esophageal reflux disease without esophagitis: Secondary | ICD-10-CM | POA: Diagnosis not present

## 2016-07-04 DIAGNOSIS — F418 Other specified anxiety disorders: Secondary | ICD-10-CM | POA: Diagnosis not present

## 2016-07-04 DIAGNOSIS — D519 Vitamin B12 deficiency anemia, unspecified: Secondary | ICD-10-CM | POA: Diagnosis not present

## 2016-07-04 DIAGNOSIS — Z7902 Long term (current) use of antithrombotics/antiplatelets: Secondary | ICD-10-CM | POA: Diagnosis not present

## 2016-07-04 DIAGNOSIS — C779 Secondary and unspecified malignant neoplasm of lymph node, unspecified: Secondary | ICD-10-CM | POA: Diagnosis not present

## 2016-07-04 DIAGNOSIS — C799 Secondary malignant neoplasm of unspecified site: Secondary | ICD-10-CM | POA: Diagnosis not present

## 2016-07-04 DIAGNOSIS — R109 Unspecified abdominal pain: Secondary | ICD-10-CM | POA: Diagnosis not present

## 2016-07-04 DIAGNOSIS — E860 Dehydration: Secondary | ICD-10-CM | POA: Diagnosis not present

## 2016-07-04 DIAGNOSIS — C787 Secondary malignant neoplasm of liver and intrahepatic bile duct: Secondary | ICD-10-CM | POA: Diagnosis not present

## 2016-07-04 DIAGNOSIS — I482 Chronic atrial fibrillation: Secondary | ICD-10-CM | POA: Diagnosis not present

## 2016-07-04 DIAGNOSIS — Z794 Long term (current) use of insulin: Secondary | ICD-10-CM | POA: Diagnosis not present

## 2016-07-05 DIAGNOSIS — R197 Diarrhea, unspecified: Secondary | ICD-10-CM | POA: Diagnosis not present

## 2016-07-05 DIAGNOSIS — C799 Secondary malignant neoplasm of unspecified site: Secondary | ICD-10-CM | POA: Diagnosis not present

## 2016-07-05 DIAGNOSIS — R112 Nausea with vomiting, unspecified: Secondary | ICD-10-CM | POA: Diagnosis not present

## 2016-07-05 DIAGNOSIS — Z8589 Personal history of malignant neoplasm of other organs and systems: Secondary | ICD-10-CM | POA: Diagnosis not present

## 2016-07-06 ENCOUNTER — Ambulatory Visit: Payer: PPO | Admitting: *Deleted

## 2016-07-06 DIAGNOSIS — K2900 Acute gastritis without bleeding: Secondary | ICD-10-CM | POA: Diagnosis not present

## 2016-07-06 DIAGNOSIS — G9340 Encephalopathy, unspecified: Secondary | ICD-10-CM | POA: Diagnosis not present

## 2016-07-06 DIAGNOSIS — C799 Secondary malignant neoplasm of unspecified site: Secondary | ICD-10-CM | POA: Diagnosis not present

## 2016-07-06 DIAGNOSIS — Z859 Personal history of malignant neoplasm, unspecified: Secondary | ICD-10-CM | POA: Diagnosis not present

## 2016-07-06 DIAGNOSIS — K529 Noninfective gastroenteritis and colitis, unspecified: Secondary | ICD-10-CM | POA: Diagnosis not present

## 2016-07-06 DIAGNOSIS — R109 Unspecified abdominal pain: Secondary | ICD-10-CM | POA: Diagnosis not present

## 2016-07-06 DIAGNOSIS — I10 Essential (primary) hypertension: Secondary | ICD-10-CM | POA: Diagnosis not present

## 2016-07-06 DIAGNOSIS — K2970 Gastritis, unspecified, without bleeding: Secondary | ICD-10-CM | POA: Diagnosis not present

## 2016-07-06 DIAGNOSIS — E119 Type 2 diabetes mellitus without complications: Secondary | ICD-10-CM | POA: Diagnosis not present

## 2016-07-06 DIAGNOSIS — R197 Diarrhea, unspecified: Secondary | ICD-10-CM | POA: Diagnosis not present

## 2016-07-06 DIAGNOSIS — C7889 Secondary malignant neoplasm of other digestive organs: Secondary | ICD-10-CM | POA: Diagnosis not present

## 2016-07-06 DIAGNOSIS — R112 Nausea with vomiting, unspecified: Secondary | ICD-10-CM | POA: Diagnosis not present

## 2016-07-07 DIAGNOSIS — E119 Type 2 diabetes mellitus without complications: Secondary | ICD-10-CM | POA: Diagnosis not present

## 2016-07-07 DIAGNOSIS — I10 Essential (primary) hypertension: Secondary | ICD-10-CM | POA: Diagnosis not present

## 2016-07-07 DIAGNOSIS — K529 Noninfective gastroenteritis and colitis, unspecified: Secondary | ICD-10-CM | POA: Diagnosis not present

## 2016-07-07 DIAGNOSIS — G9340 Encephalopathy, unspecified: Secondary | ICD-10-CM | POA: Diagnosis not present

## 2016-07-07 DIAGNOSIS — C799 Secondary malignant neoplasm of unspecified site: Secondary | ICD-10-CM | POA: Diagnosis not present

## 2016-07-07 DIAGNOSIS — R197 Diarrhea, unspecified: Secondary | ICD-10-CM | POA: Diagnosis not present

## 2016-07-07 DIAGNOSIS — R112 Nausea with vomiting, unspecified: Secondary | ICD-10-CM | POA: Diagnosis not present

## 2016-07-07 DIAGNOSIS — R109 Unspecified abdominal pain: Secondary | ICD-10-CM | POA: Diagnosis not present

## 2016-07-11 ENCOUNTER — Other Ambulatory Visit: Payer: Self-pay | Admitting: *Deleted

## 2016-07-11 DIAGNOSIS — R410 Disorientation, unspecified: Secondary | ICD-10-CM | POA: Diagnosis not present

## 2016-07-11 DIAGNOSIS — F320 Major depressive disorder, single episode, mild: Secondary | ICD-10-CM | POA: Diagnosis not present

## 2016-07-11 DIAGNOSIS — I10 Essential (primary) hypertension: Secondary | ICD-10-CM | POA: Diagnosis not present

## 2016-07-11 DIAGNOSIS — R112 Nausea with vomiting, unspecified: Secondary | ICD-10-CM | POA: Diagnosis not present

## 2016-07-11 DIAGNOSIS — R197 Diarrhea, unspecified: Secondary | ICD-10-CM | POA: Diagnosis not present

## 2016-07-11 NOTE — Patient Outreach (Signed)
[facility]) Care Management  07/11/2016  Christopher Clark [birth year] [id number]   Transition of care call  Admitted 3/26 to [facility]  AJ:OINOMV, Vomiting Enteritis, Abdominal pain acute encephalopathy Discharged home on 07/07/16  Placed call to patient reports he is still not feeling too good, he discussed he had office visit with Dr.Penner on this morning. Patient then passed phone to his wife Christopher Clark discussed she  is working with patient on trying to increase his activity at home, discussed they walked the length of driveway once on today. Christopher Clark discussed Dr.Penner has ordered patient a new medication for depression that she will pick up tomorrow when pharmacy will have available. Christopher Clark discussed patient still has some difficulty with sleeping at night.  Patient was recently discharged from hospital and all medications have been reviewed. Patient continues to take medications as prescribed, remains on flagyl and Cipro.   Mrs. Cappella denies patient having any problem with diarrhea or vomiting on today. Taking diet slowly and continues to drink Glucerna like drink on today. Patient's blood sugar was 124 today, he takes his insulin at bedtime and is no longer on metformin.Denies episodes of hypoglycemia  Christopher Clark has placed phone call to Dr.Gupta, GI doctor awaiting follow up call regarding post hospital office visit, also patient has scheduled appointment with Dr.Lewis.   Plan Will continue weekly transition of care outreaches, next call in a week. Patient will notify MD of worsening of symptoms.    Joylene Draft, RN, [place] Management [phone]- Mobile [phone]- Toll Free Main Office

## 2016-07-15 ENCOUNTER — Other Ambulatory Visit: Payer: Self-pay | Admitting: Radiation Therapy

## 2016-07-15 DIAGNOSIS — R109 Unspecified abdominal pain: Secondary | ICD-10-CM | POA: Diagnosis not present

## 2016-07-15 DIAGNOSIS — E860 Dehydration: Secondary | ICD-10-CM | POA: Diagnosis not present

## 2016-07-15 DIAGNOSIS — I10 Essential (primary) hypertension: Secondary | ICD-10-CM | POA: Diagnosis not present

## 2016-07-15 DIAGNOSIS — C799 Secondary malignant neoplasm of unspecified site: Secondary | ICD-10-CM | POA: Diagnosis not present

## 2016-07-15 DIAGNOSIS — E1121 Type 2 diabetes mellitus with diabetic nephropathy: Secondary | ICD-10-CM | POA: Diagnosis not present

## 2016-07-15 DIAGNOSIS — E119 Type 2 diabetes mellitus without complications: Secondary | ICD-10-CM | POA: Diagnosis not present

## 2016-07-15 DIAGNOSIS — C250 Malignant neoplasm of head of pancreas: Secondary | ICD-10-CM | POA: Diagnosis not present

## 2016-07-15 DIAGNOSIS — E1122 Type 2 diabetes mellitus with diabetic chronic kidney disease: Secondary | ICD-10-CM | POA: Diagnosis not present

## 2016-07-15 DIAGNOSIS — C787 Secondary malignant neoplasm of liver and intrahepatic bile duct: Secondary | ICD-10-CM | POA: Diagnosis not present

## 2016-07-15 DIAGNOSIS — Z859 Personal history of malignant neoplasm, unspecified: Secondary | ICD-10-CM | POA: Diagnosis not present

## 2016-07-15 DIAGNOSIS — K830 Cholangitis: Secondary | ICD-10-CM | POA: Diagnosis not present

## 2016-07-15 DIAGNOSIS — R180 Malignant ascites: Secondary | ICD-10-CM | POA: Diagnosis not present

## 2016-07-15 DIAGNOSIS — R509 Fever, unspecified: Secondary | ICD-10-CM | POA: Diagnosis not present

## 2016-07-15 DIAGNOSIS — I481 Persistent atrial fibrillation: Secondary | ICD-10-CM | POA: Diagnosis not present

## 2016-07-15 DIAGNOSIS — R918 Other nonspecific abnormal finding of lung field: Secondary | ICD-10-CM | POA: Diagnosis not present

## 2016-07-15 DIAGNOSIS — C779 Secondary and unspecified malignant neoplasm of lymph node, unspecified: Secondary | ICD-10-CM | POA: Diagnosis not present

## 2016-07-15 DIAGNOSIS — K219 Gastro-esophageal reflux disease without esophagitis: Secondary | ICD-10-CM | POA: Diagnosis not present

## 2016-07-15 DIAGNOSIS — A419 Sepsis, unspecified organism: Secondary | ICD-10-CM | POA: Diagnosis not present

## 2016-07-15 DIAGNOSIS — F418 Other specified anxiety disorders: Secondary | ICD-10-CM | POA: Diagnosis not present

## 2016-07-15 DIAGNOSIS — Z7984 Long term (current) use of oral hypoglycemic drugs: Secondary | ICD-10-CM | POA: Diagnosis not present

## 2016-07-15 DIAGNOSIS — Z794 Long term (current) use of insulin: Secondary | ICD-10-CM | POA: Diagnosis not present

## 2016-07-15 DIAGNOSIS — E43 Unspecified severe protein-calorie malnutrition: Secondary | ICD-10-CM | POA: Diagnosis not present

## 2016-07-15 DIAGNOSIS — Z85841 Personal history of malignant neoplasm of brain: Secondary | ICD-10-CM | POA: Diagnosis not present

## 2016-07-15 DIAGNOSIS — N182 Chronic kidney disease, stage 2 (mild): Secondary | ICD-10-CM | POA: Diagnosis not present

## 2016-07-15 DIAGNOSIS — I129 Hypertensive chronic kidney disease with stage 1 through stage 4 chronic kidney disease, or unspecified chronic kidney disease: Secondary | ICD-10-CM | POA: Diagnosis not present

## 2016-07-15 DIAGNOSIS — Z66 Do not resuscitate: Secondary | ICD-10-CM | POA: Diagnosis not present

## 2016-07-15 DIAGNOSIS — R188 Other ascites: Secondary | ICD-10-CM | POA: Diagnosis not present

## 2016-07-15 DIAGNOSIS — I4891 Unspecified atrial fibrillation: Secondary | ICD-10-CM | POA: Diagnosis not present

## 2016-07-15 DIAGNOSIS — C7931 Secondary malignant neoplasm of brain: Secondary | ICD-10-CM

## 2016-07-15 DIAGNOSIS — Z791 Long term (current) use of non-steroidal anti-inflammatories (NSAID): Secondary | ICD-10-CM | POA: Diagnosis not present

## 2016-07-15 DIAGNOSIS — E785 Hyperlipidemia, unspecified: Secondary | ICD-10-CM | POA: Diagnosis not present

## 2016-07-15 DIAGNOSIS — K831 Obstruction of bile duct: Secondary | ICD-10-CM | POA: Diagnosis not present

## 2016-07-16 DIAGNOSIS — R509 Fever, unspecified: Secondary | ICD-10-CM | POA: Diagnosis not present

## 2016-07-16 DIAGNOSIS — I480 Paroxysmal atrial fibrillation: Secondary | ICD-10-CM | POA: Diagnosis not present

## 2016-07-16 DIAGNOSIS — E785 Hyperlipidemia, unspecified: Secondary | ICD-10-CM | POA: Diagnosis not present

## 2016-07-16 DIAGNOSIS — I491 Atrial premature depolarization: Secondary | ICD-10-CM | POA: Diagnosis not present

## 2016-07-16 DIAGNOSIS — I080 Rheumatic disorders of both mitral and aortic valves: Secondary | ICD-10-CM | POA: Diagnosis not present

## 2016-07-16 DIAGNOSIS — Z859 Personal history of malignant neoplasm, unspecified: Secondary | ICD-10-CM | POA: Diagnosis not present

## 2016-07-16 DIAGNOSIS — I10 Essential (primary) hypertension: Secondary | ICD-10-CM | POA: Diagnosis not present

## 2016-07-16 DIAGNOSIS — C253 Malignant neoplasm of pancreatic duct: Secondary | ICD-10-CM | POA: Diagnosis not present

## 2016-07-16 DIAGNOSIS — E119 Type 2 diabetes mellitus without complications: Secondary | ICD-10-CM | POA: Diagnosis not present

## 2016-07-16 DIAGNOSIS — E1122 Type 2 diabetes mellitus with diabetic chronic kidney disease: Secondary | ICD-10-CM | POA: Diagnosis not present

## 2016-07-16 DIAGNOSIS — K219 Gastro-esophageal reflux disease without esophagitis: Secondary | ICD-10-CM | POA: Diagnosis not present

## 2016-07-16 DIAGNOSIS — R9431 Abnormal electrocardiogram [ECG] [EKG]: Secondary | ICD-10-CM | POA: Diagnosis not present

## 2016-07-16 DIAGNOSIS — I4891 Unspecified atrial fibrillation: Secondary | ICD-10-CM | POA: Diagnosis not present

## 2016-07-16 DIAGNOSIS — C799 Secondary malignant neoplasm of unspecified site: Secondary | ICD-10-CM | POA: Diagnosis not present

## 2016-07-16 DIAGNOSIS — K830 Cholangitis: Secondary | ICD-10-CM | POA: Diagnosis not present

## 2016-07-16 DIAGNOSIS — R109 Unspecified abdominal pain: Secondary | ICD-10-CM | POA: Diagnosis not present

## 2016-07-17 DIAGNOSIS — I10 Essential (primary) hypertension: Secondary | ICD-10-CM | POA: Diagnosis not present

## 2016-07-17 DIAGNOSIS — R109 Unspecified abdominal pain: Secondary | ICD-10-CM | POA: Diagnosis not present

## 2016-07-17 DIAGNOSIS — R509 Fever, unspecified: Secondary | ICD-10-CM | POA: Diagnosis not present

## 2016-07-17 DIAGNOSIS — K219 Gastro-esophageal reflux disease without esophagitis: Secondary | ICD-10-CM | POA: Diagnosis not present

## 2016-07-17 DIAGNOSIS — E1122 Type 2 diabetes mellitus with diabetic chronic kidney disease: Secondary | ICD-10-CM | POA: Diagnosis not present

## 2016-07-17 DIAGNOSIS — Z79899 Other long term (current) drug therapy: Secondary | ICD-10-CM | POA: Diagnosis not present

## 2016-07-17 DIAGNOSIS — R000 Tachycardia, unspecified: Secondary | ICD-10-CM | POA: Diagnosis not present

## 2016-07-17 DIAGNOSIS — C253 Malignant neoplasm of pancreatic duct: Secondary | ICD-10-CM | POA: Diagnosis not present

## 2016-07-17 DIAGNOSIS — K830 Cholangitis: Secondary | ICD-10-CM | POA: Diagnosis not present

## 2016-07-17 DIAGNOSIS — E119 Type 2 diabetes mellitus without complications: Secondary | ICD-10-CM | POA: Diagnosis not present

## 2016-07-17 DIAGNOSIS — I480 Paroxysmal atrial fibrillation: Secondary | ICD-10-CM | POA: Diagnosis not present

## 2016-07-17 DIAGNOSIS — Z859 Personal history of malignant neoplasm, unspecified: Secondary | ICD-10-CM | POA: Diagnosis not present

## 2016-07-17 DIAGNOSIS — E785 Hyperlipidemia, unspecified: Secondary | ICD-10-CM | POA: Diagnosis not present

## 2016-07-17 DIAGNOSIS — I4891 Unspecified atrial fibrillation: Secondary | ICD-10-CM | POA: Diagnosis not present

## 2016-07-17 DIAGNOSIS — C799 Secondary malignant neoplasm of unspecified site: Secondary | ICD-10-CM | POA: Diagnosis not present

## 2016-07-18 DIAGNOSIS — K831 Obstruction of bile duct: Secondary | ICD-10-CM | POA: Diagnosis not present

## 2016-07-18 DIAGNOSIS — E119 Type 2 diabetes mellitus without complications: Secondary | ICD-10-CM | POA: Diagnosis not present

## 2016-07-18 DIAGNOSIS — R509 Fever, unspecified: Secondary | ICD-10-CM | POA: Diagnosis not present

## 2016-07-18 DIAGNOSIS — C7931 Secondary malignant neoplasm of brain: Secondary | ICD-10-CM | POA: Diagnosis not present

## 2016-07-18 DIAGNOSIS — I119 Hypertensive heart disease without heart failure: Secondary | ICD-10-CM | POA: Diagnosis not present

## 2016-07-18 DIAGNOSIS — I4891 Unspecified atrial fibrillation: Secondary | ICD-10-CM | POA: Diagnosis not present

## 2016-07-18 DIAGNOSIS — K219 Gastro-esophageal reflux disease without esophagitis: Secondary | ICD-10-CM | POA: Diagnosis not present

## 2016-07-18 DIAGNOSIS — Z859 Personal history of malignant neoplasm, unspecified: Secondary | ICD-10-CM | POA: Diagnosis not present

## 2016-07-18 DIAGNOSIS — E1122 Type 2 diabetes mellitus with diabetic chronic kidney disease: Secondary | ICD-10-CM | POA: Diagnosis not present

## 2016-07-18 DIAGNOSIS — C259 Malignant neoplasm of pancreas, unspecified: Secondary | ICD-10-CM | POA: Diagnosis not present

## 2016-07-18 DIAGNOSIS — Z79899 Other long term (current) drug therapy: Secondary | ICD-10-CM | POA: Diagnosis not present

## 2016-07-18 DIAGNOSIS — R109 Unspecified abdominal pain: Secondary | ICD-10-CM | POA: Diagnosis not present

## 2016-07-18 DIAGNOSIS — I10 Essential (primary) hypertension: Secondary | ICD-10-CM | POA: Diagnosis not present

## 2016-07-18 DIAGNOSIS — E785 Hyperlipidemia, unspecified: Secondary | ICD-10-CM | POA: Diagnosis not present

## 2016-07-18 DIAGNOSIS — K830 Cholangitis: Secondary | ICD-10-CM | POA: Diagnosis not present

## 2016-07-18 DIAGNOSIS — R918 Other nonspecific abnormal finding of lung field: Secondary | ICD-10-CM | POA: Diagnosis not present

## 2016-07-18 DIAGNOSIS — C799 Secondary malignant neoplasm of unspecified site: Secondary | ICD-10-CM | POA: Diagnosis not present

## 2016-07-18 DIAGNOSIS — I480 Paroxysmal atrial fibrillation: Secondary | ICD-10-CM | POA: Diagnosis not present

## 2016-07-18 DIAGNOSIS — A419 Sepsis, unspecified organism: Secondary | ICD-10-CM | POA: Diagnosis not present

## 2016-07-18 DIAGNOSIS — Z794 Long term (current) use of insulin: Secondary | ICD-10-CM | POA: Diagnosis not present

## 2016-07-19 DIAGNOSIS — A419 Sepsis, unspecified organism: Secondary | ICD-10-CM | POA: Diagnosis not present

## 2016-07-19 DIAGNOSIS — E119 Type 2 diabetes mellitus without complications: Secondary | ICD-10-CM | POA: Diagnosis not present

## 2016-07-19 DIAGNOSIS — I998 Other disorder of circulatory system: Secondary | ICD-10-CM | POA: Diagnosis not present

## 2016-07-19 DIAGNOSIS — K831 Obstruction of bile duct: Secondary | ICD-10-CM | POA: Diagnosis not present

## 2016-07-19 DIAGNOSIS — I4430 Unspecified atrioventricular block: Secondary | ICD-10-CM | POA: Diagnosis not present

## 2016-07-19 DIAGNOSIS — K830 Cholangitis: Secondary | ICD-10-CM | POA: Diagnosis not present

## 2016-07-19 DIAGNOSIS — C259 Malignant neoplasm of pancreas, unspecified: Secondary | ICD-10-CM | POA: Diagnosis not present

## 2016-07-19 DIAGNOSIS — I10 Essential (primary) hypertension: Secondary | ICD-10-CM | POA: Diagnosis not present

## 2016-07-19 DIAGNOSIS — C257 Malignant neoplasm of other parts of pancreas: Secondary | ICD-10-CM | POA: Diagnosis not present

## 2016-07-19 DIAGNOSIS — Z794 Long term (current) use of insulin: Secondary | ICD-10-CM | POA: Diagnosis not present

## 2016-07-19 DIAGNOSIS — C7931 Secondary malignant neoplasm of brain: Secondary | ICD-10-CM | POA: Diagnosis not present

## 2016-07-19 DIAGNOSIS — I4892 Unspecified atrial flutter: Secondary | ICD-10-CM | POA: Diagnosis not present

## 2016-07-19 DIAGNOSIS — I480 Paroxysmal atrial fibrillation: Secondary | ICD-10-CM | POA: Diagnosis not present

## 2016-07-20 ENCOUNTER — Other Ambulatory Visit: Payer: Self-pay | Admitting: *Deleted

## 2016-07-20 DIAGNOSIS — C253 Malignant neoplasm of pancreatic duct: Secondary | ICD-10-CM | POA: Diagnosis not present

## 2016-07-20 DIAGNOSIS — R918 Other nonspecific abnormal finding of lung field: Secondary | ICD-10-CM | POA: Diagnosis not present

## 2016-07-20 DIAGNOSIS — Z794 Long term (current) use of insulin: Secondary | ICD-10-CM | POA: Diagnosis not present

## 2016-07-20 DIAGNOSIS — C259 Malignant neoplasm of pancreas, unspecified: Secondary | ICD-10-CM | POA: Diagnosis not present

## 2016-07-20 DIAGNOSIS — N189 Chronic kidney disease, unspecified: Secondary | ICD-10-CM | POA: Insufficient documentation

## 2016-07-20 DIAGNOSIS — I4589 Other specified conduction disorders: Secondary | ICD-10-CM | POA: Diagnosis not present

## 2016-07-20 DIAGNOSIS — N183 Chronic kidney disease, stage 3 unspecified: Secondary | ICD-10-CM

## 2016-07-20 DIAGNOSIS — I10 Essential (primary) hypertension: Secondary | ICD-10-CM | POA: Diagnosis not present

## 2016-07-20 DIAGNOSIS — K830 Cholangitis: Secondary | ICD-10-CM | POA: Diagnosis not present

## 2016-07-20 DIAGNOSIS — I998 Other disorder of circulatory system: Secondary | ICD-10-CM | POA: Diagnosis not present

## 2016-07-20 DIAGNOSIS — A419 Sepsis, unspecified organism: Secondary | ICD-10-CM | POA: Diagnosis not present

## 2016-07-20 DIAGNOSIS — C7931 Secondary malignant neoplasm of brain: Secondary | ICD-10-CM | POA: Diagnosis not present

## 2016-07-20 DIAGNOSIS — I480 Paroxysmal atrial fibrillation: Secondary | ICD-10-CM | POA: Diagnosis not present

## 2016-07-20 DIAGNOSIS — E119 Type 2 diabetes mellitus without complications: Secondary | ICD-10-CM | POA: Diagnosis not present

## 2016-07-20 DIAGNOSIS — J984 Other disorders of lung: Secondary | ICD-10-CM | POA: Diagnosis not present

## 2016-07-20 DIAGNOSIS — I219 Acute myocardial infarction, unspecified: Secondary | ICD-10-CM | POA: Diagnosis not present

## 2016-07-20 DIAGNOSIS — I4892 Unspecified atrial flutter: Secondary | ICD-10-CM | POA: Diagnosis not present

## 2016-07-20 DIAGNOSIS — I2129 ST elevation (STEMI) myocardial infarction involving other sites: Secondary | ICD-10-CM | POA: Diagnosis not present

## 2016-07-20 DIAGNOSIS — R000 Tachycardia, unspecified: Secondary | ICD-10-CM | POA: Diagnosis not present

## 2016-07-20 DIAGNOSIS — I4891 Unspecified atrial fibrillation: Secondary | ICD-10-CM | POA: Insufficient documentation

## 2016-07-20 NOTE — Patient Outreach (Signed)
[place] [facility]) Care Management  07/20/2016  Christopher Clark Sep 01, 1940 [id number]   Transition of care  Placed call  for transition of care, spoke with [name], she briefly discussed patient current condition related to increased bilirubin, fluid collection in abdomen and problems with increased heart rate. She reports patient is currently inpatient at [facility], admitted to [facility] on 4/6 and transferred to [facility] on 4/9.    Plan Will continue to follow progress and plan, next call in a week.   Joylene Draft, RN, [place] Management [phone]- Mobile [phone]- Toll Free Main Office

## 2016-07-21 DIAGNOSIS — J9601 Acute respiratory failure with hypoxia: Secondary | ICD-10-CM | POA: Diagnosis not present

## 2016-07-21 DIAGNOSIS — R0902 Hypoxemia: Secondary | ICD-10-CM | POA: Diagnosis not present

## 2016-07-21 DIAGNOSIS — Z794 Long term (current) use of insulin: Secondary | ICD-10-CM | POA: Diagnosis not present

## 2016-07-21 DIAGNOSIS — C253 Malignant neoplasm of pancreatic duct: Secondary | ICD-10-CM | POA: Diagnosis not present

## 2016-07-21 DIAGNOSIS — I10 Essential (primary) hypertension: Secondary | ICD-10-CM | POA: Diagnosis not present

## 2016-07-21 DIAGNOSIS — K830 Cholangitis: Secondary | ICD-10-CM | POA: Diagnosis not present

## 2016-07-21 DIAGNOSIS — C259 Malignant neoplasm of pancreas, unspecified: Secondary | ICD-10-CM | POA: Diagnosis not present

## 2016-07-21 DIAGNOSIS — I480 Paroxysmal atrial fibrillation: Secondary | ICD-10-CM | POA: Diagnosis not present

## 2016-07-21 DIAGNOSIS — E119 Type 2 diabetes mellitus without complications: Secondary | ICD-10-CM | POA: Diagnosis not present

## 2016-07-21 DIAGNOSIS — C7931 Secondary malignant neoplasm of brain: Secondary | ICD-10-CM | POA: Diagnosis not present

## 2016-07-21 DIAGNOSIS — A419 Sepsis, unspecified organism: Secondary | ICD-10-CM | POA: Diagnosis not present

## 2016-07-28 ENCOUNTER — Encounter: Payer: Self-pay | Admitting: *Deleted

## 2016-07-28 ENCOUNTER — Other Ambulatory Visit: Payer: Self-pay | Admitting: *Deleted

## 2016-07-28 NOTE — Patient Outreach (Signed)
[place] [facility]) Care Management  07/28/2016  Christopher Clark 19-Feb-1941 [id number]  Late entry  Contacted PCP office , verified that patient has passed on 07-28-16 at [place] Will close case per protocol and notify CMA. Will send MD case closure.  Joylene Draft, RN, [place] Management [phone]- Mobile [phone]- Toll Free Main Office

## 2016-08-09 DEATH — deceased

## 2016-08-10 ENCOUNTER — Encounter: Payer: Self-pay | Admitting: Radiation Therapy

## 2016-08-10 NOTE — Progress Notes (Signed)
Pt. 's Wife called to inform us that her husband died a few weeks ago. Here is a clip from his obituary.

## 2016-08-18 ENCOUNTER — Ambulatory Visit (HOSPITAL_COMMUNITY): Payer: PPO

## 2016-08-22 ENCOUNTER — Ambulatory Visit: Payer: PPO | Admitting: Radiation Oncology

## 2017-03-12 IMAGING — MR MR HEAD WO/W CM
10 of 11 series · 27 of 48 positions shown · IV contrast (cc MULTI)
Comparison: 03/28/2016 CT.  MRI 12/31/2015.

CLINICAL DATA: S RS protocol for previously treated brain
metastases.

EXAM:
MRI HEAD WITHOUT AND WITH CONTRAST
TECHNIQUE: Multiplanar, multiecho pulse sequences of the brain and surrounding
structures were obtained without and with intravenous contrast.
CONTRAST:  15mL MULTIHANCE GADOBENATE DIMEGLUMINE 529 MG/ML IV SOLN

[Series 2: FLAIR · sagittal · 3.0mm · 0.49mm/px · 3 of 50 slices shown (1 of 3)]
[im 1/50]
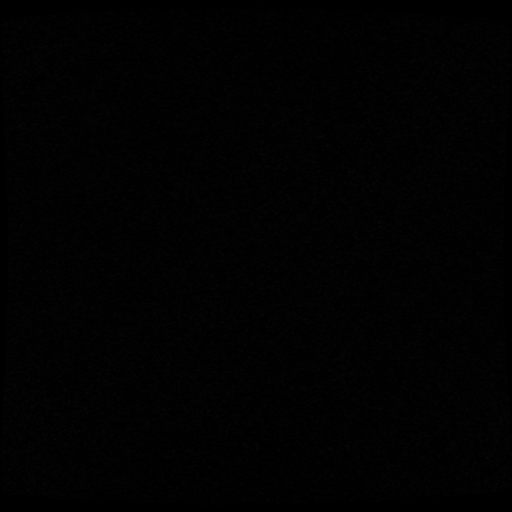
[im 25/50]
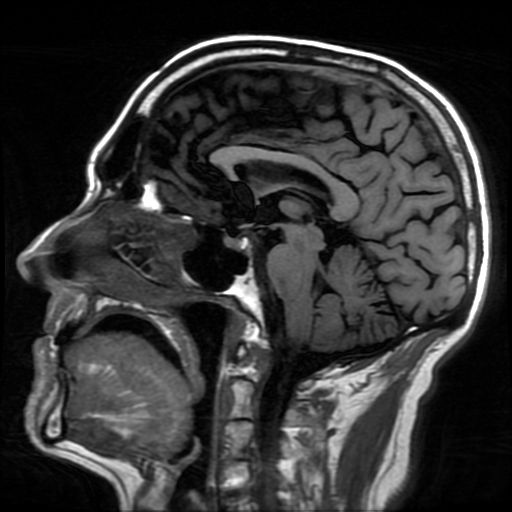
[im 50/50]
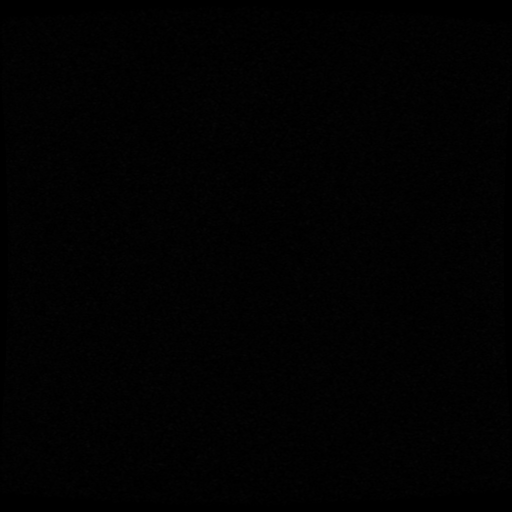

[Series 4: DWI · axial · 3.0mm · 0.98mm/px · z∈[-60,+126]mm · 7 of 126 slices shown]
[im 1/126]
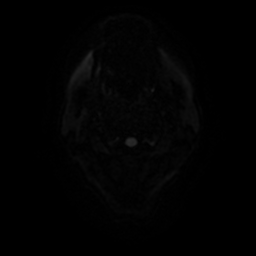
[im 21/126]
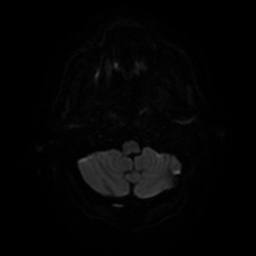
[im 42/126]
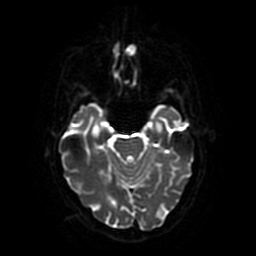
[im 63/126]
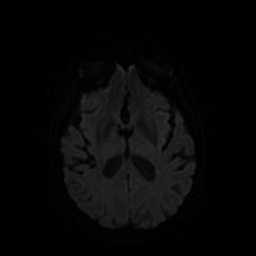
[im 84/126]
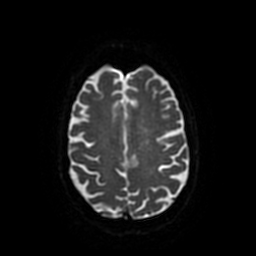
[im 105/126]
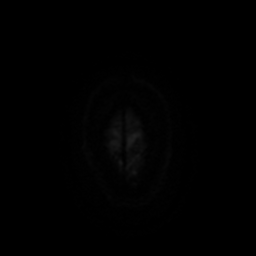
[im 126/126]
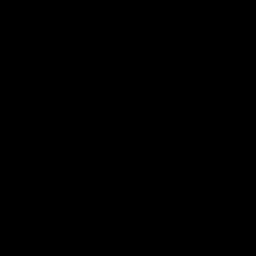

[Series 5: T2 · axial · 5.0mm · 0.49mm/px · z∈[-60,+126]mm · 2 of 32 slices shown]
[im 1/32]
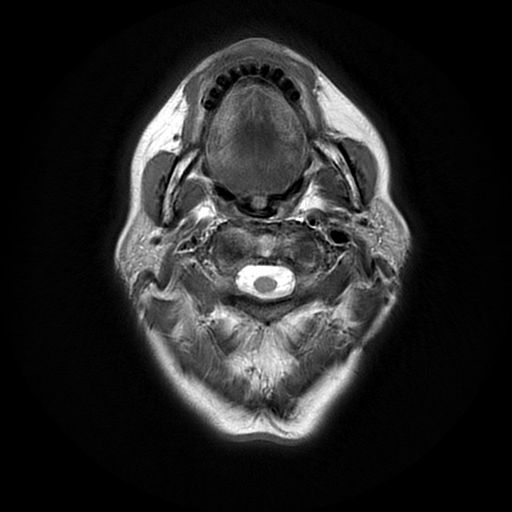
[im 32/32]
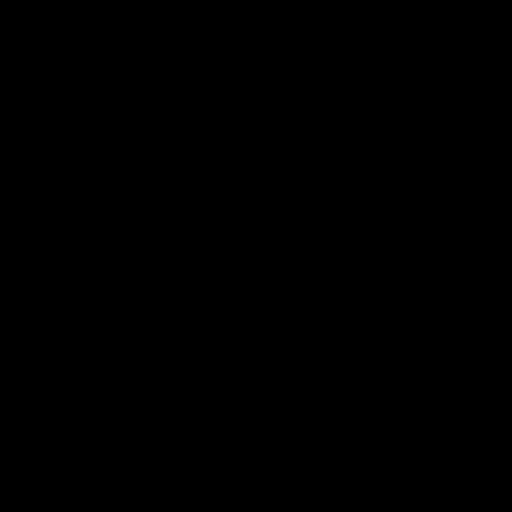

[Series 6: GRE · axial · 5.0mm · 0.49mm/px · z∈[-60,+126]mm · 2 of 32 slices shown]
[im 1/32]
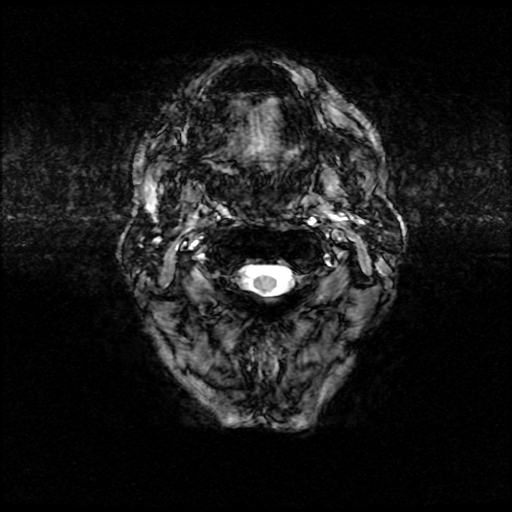
[im 32/32]
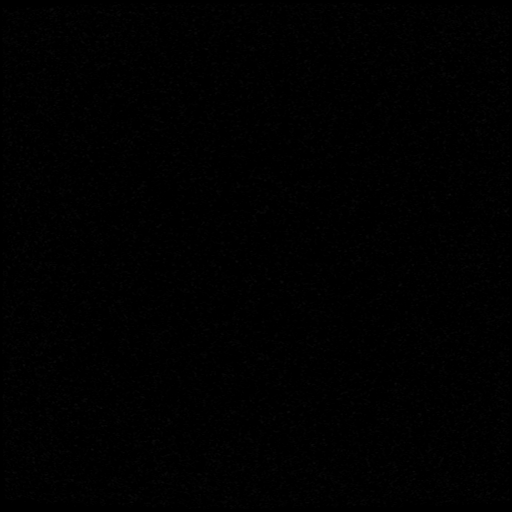

[Series 7: ax 3(person_name) · axial · 1.0mm · 0.98mm/px · 1 of 186 slices shown]
[im 1/186]
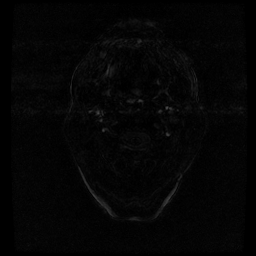

[Series 8: FLAIR · axial · 4.0mm · 0.49mm/px · z∈[-56,+125]mm · 2 of 34 slices shown (2 of 3)]
[im 1/34]
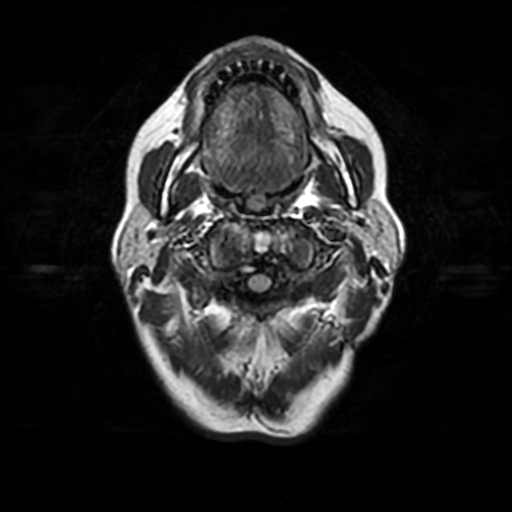
[im 34/34]
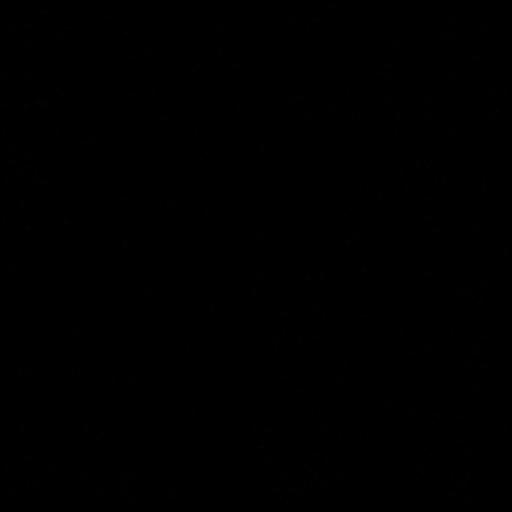

[Series 9: T2 post-contrast · coronal · 5.0mm · 0.41mm/px · 2 of 43 slices shown]
[im 1/43]
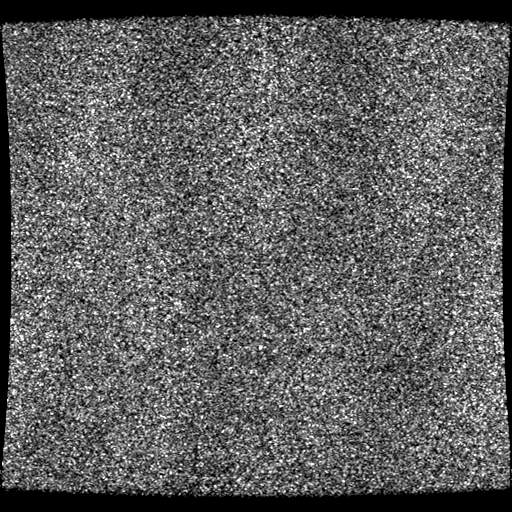
[im 43/43]
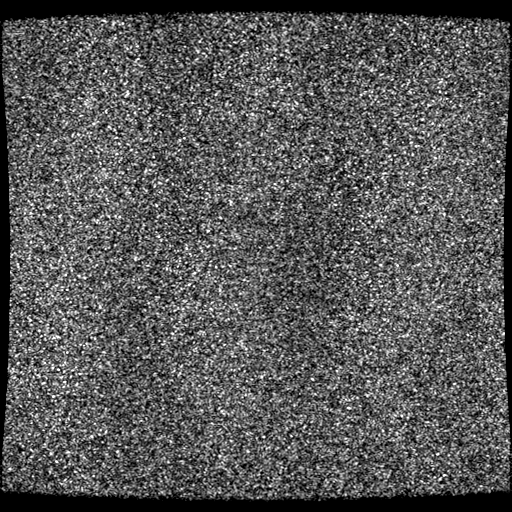

[Series 11: T1 · coronal · 5.0mm · 0.41mm/px · 2 of 43 slices shown]
[im 1/43]
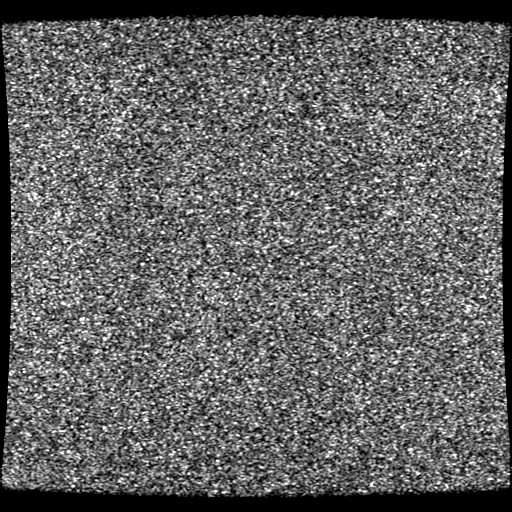
[im 43/43]
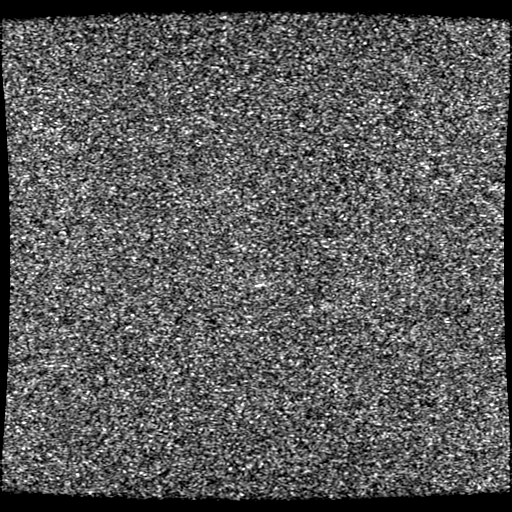

[Series 12: FLAIR · sagittal · 3.0mm · 0.49mm/px · 3 of 50 slices shown (3 of 3)]
[im 1/50]
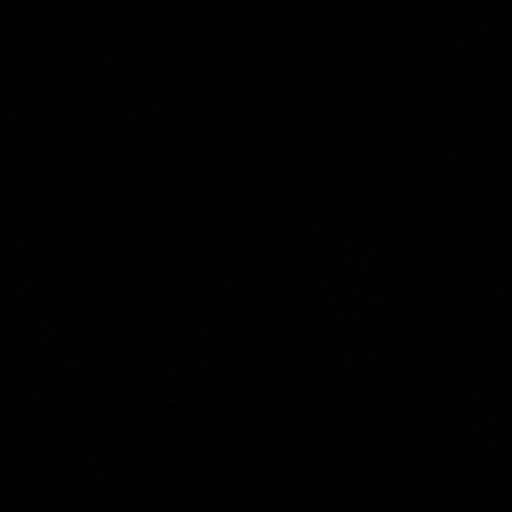
[im 25/50]
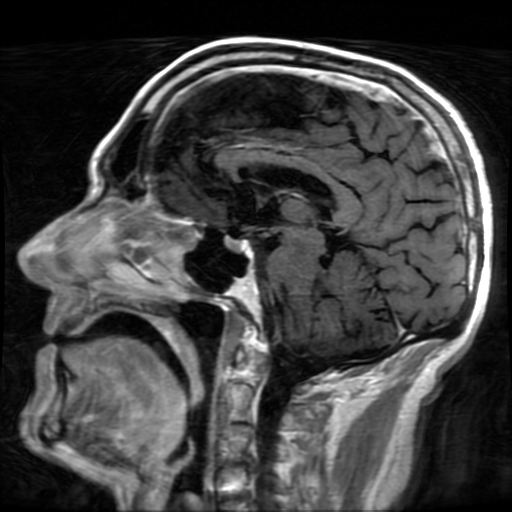
[im 50/50]
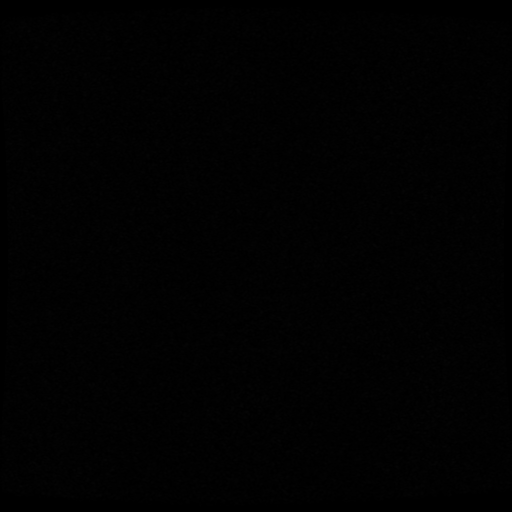

[Series 450: ADC · axial · 3.0mm · 0.98mm/px · z∈[-60,+126]mm · 3 of 61 slices shown]
[im 1/61]
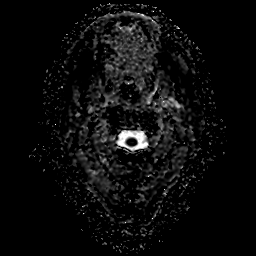
[im 31/61]
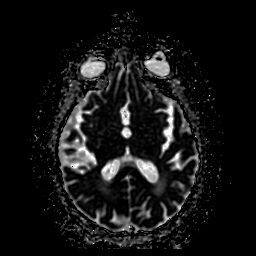
[im 61/61]
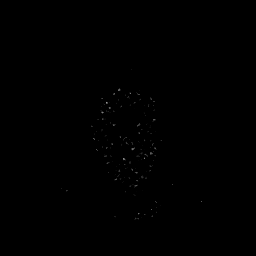

[27 of 48 positions shown; findings below may reference images not displayed]

FINDINGS: Brain: Previous left occipital craniotomy for resection of a
metastatic lesion. Mild postoperative scarring in that region shows
contraction. No progressive change to suggest residual or recurrent
disease. No new lesions seen elsewhere within the brain. Chronic
small-vessel ischemic changes affect the deep white matter. No
evidence of recent infarction. No hydrocephalus or extra-axial
collection.

Vascular: Major vessels at the base of the brain show flow.

Skull and upper cervical spine: Negative

Sinuses/Orbits: Clear/normal

Other: None significant
IMPRESSION: No evidence of residual or recurrent disease. Chronic left
cerebellar scarring related to previous surgical resection.
